# Patient Record
Sex: Female | Born: 1975
Health system: Southern US, Community
[De-identification: ages and names within clinical notes are randomized; demographics above are authoritative.]

## PROBLEM LIST (undated history)

## (undated) ENCOUNTER — Inpatient Hospital Stay (HOSPITAL_COMMUNITY): Payer: Self-pay

## (undated) DIAGNOSIS — F419 Anxiety disorder, unspecified: Secondary | ICD-10-CM

## (undated) DIAGNOSIS — D696 Thrombocytopenia, unspecified: Secondary | ICD-10-CM

## (undated) DIAGNOSIS — F329 Major depressive disorder, single episode, unspecified: Secondary | ICD-10-CM

## (undated) DIAGNOSIS — F32A Depression, unspecified: Secondary | ICD-10-CM

## (undated) DIAGNOSIS — O99113 Other diseases of the blood and blood-forming organs and certain disorders involving the immune mechanism complicating pregnancy, third trimester: Secondary | ICD-10-CM

## (undated) HISTORY — PX: NO PAST SURGERIES: SHX2092

---

## 2014-04-18 ENCOUNTER — Ambulatory Visit: Payer: 59 | Admitting: Podiatrist

## 2014-04-25 ENCOUNTER — Ambulatory Visit: Payer: 59 | Admitting: Podiatrist

## 2014-05-13 ENCOUNTER — Other Ambulatory Visit (HOSPITAL_COMMUNITY): Payer: Self-pay | Admitting: Gynecology

## 2014-05-13 DIAGNOSIS — Z3141 Encounter for fertility testing: Secondary | ICD-10-CM

## 2014-05-14 ENCOUNTER — Encounter: Payer: Self-pay | Admitting: Podiatrist

## 2014-05-14 ENCOUNTER — Ambulatory Visit (INDEPENDENT_AMBULATORY_CARE_PROVIDER_SITE_OTHER): Payer: 59 | Admitting: Podiatrist

## 2014-05-14 ENCOUNTER — Ambulatory Visit (INDEPENDENT_AMBULATORY_CARE_PROVIDER_SITE_OTHER): Payer: 59

## 2014-05-14 ENCOUNTER — Other Ambulatory Visit: Payer: Self-pay | Admitting: Podiatrist

## 2014-05-14 VITALS — BP 91/53 | HR 66 | Resp 16

## 2014-05-14 DIAGNOSIS — M722 Plantar fascial fibromatosis: Secondary | ICD-10-CM

## 2014-05-14 DIAGNOSIS — D3610 Benign neoplasm of peripheral nerves and autonomic nervous system, unspecified: Secondary | ICD-10-CM

## 2014-05-14 DIAGNOSIS — M779 Enthesopathy, unspecified: Secondary | ICD-10-CM

## 2014-05-14 NOTE — Progress Notes (Signed)
   Subjective:    Patient ID: Linda Church, female    DOB: Jun 15, 1975, 39 y.o.   MRN: [id number]  HPI Comments: "I have fasciitis"  Patient c/o aching plantar heel and arch for few months. She does have AM pain. She is an active runner, running about 20 miles per week. She has decreased to 3-5 miles per week. She takes Ibuprofen when really bad.  Also, numbness in 3rd and 4th toes left with some pain in forefoot. She thinks she may have a neuroma.  Foot Pain Associated symptoms include myalgias and numbness.      Review of Systems  Musculoskeletal: Positive for myalgias.  Neurological: Positive for numbness.  All other systems reviewed and are negative.      Objective:   Physical Exam GENERAL APPEARANCE: Alert, conversant. Appropriately groomed. No acute distress.  VASCULAR: Pedal pulses palpable and strong bilateral.  Capillary refill time is immediate to all digits,  Proximal to distal cooling it warm to warm.  Digital hair growth is present bilateral  NEUROLOGIC: sensation is intact epicritically and protectively to 5.07 monofilament at 5/5 sites bilateral.  Light touch is intact bilateral, vibratory sensation intact bilateral, achilles tendon reflex is intact bilateral.  Negative Tinel sign is elicited mild neuroma type symptomatology is noted left forefoot MUSCULOSKELETAL: Pain on palpation plantar medial aspect left foot  at insertion of plantar fascia on the medial calcaneal tubercle. Inflammation at the insertion of the plantar fascia is present. Rectus foot type is seen. DERMATOLOGIC: skin color, texture, and turger are within normal limits.  No preulcerative lesions are seen, no interdigital maceration noted.  No open lesions present.  Digital nails are asymptomatic.      Assessment & Plan:  Plantar fasciitis, neuroma  Plan:  Recommended orthotics for heel pain and neuroma symptomatology- she was scanned at today's visit.  Also discussed injection therapy as well.   Will see her when orthotics are ready for pick up.

## 2014-05-14 NOTE — Patient Instructions (Signed)

## 2014-05-16 ENCOUNTER — Ambulatory Visit (INDEPENDENT_AMBULATORY_CARE_PROVIDER_SITE_OTHER): Payer: 59 | Admitting: Internal Medicine

## 2014-05-16 DIAGNOSIS — Z23 Encounter for immunization: Secondary | ICD-10-CM

## 2014-05-16 NOTE — Progress Notes (Signed)
   Subjective:    Patient ID: Leo Grosser, female    DOB: 31-Jul-1975, 39 y.o.   MRN: [id number]  HPI ledora delker is a 39yo F who is going to [place] from Feb 19 for 10 day trip with her significant [place]. She has not travelled internationally before. Vaccinated to hav, tdap, flu, rabies, since she is a vetinarian  Current Outpatient Prescriptions on File Prior to Visit  Medication Sig Dispense Refill  . buPROPion (WELLBUTRIN XL) 150 MG 24 hr tablet Take 150 mg by mouth daily.    Marland Kitchen escitalopram (LEXAPRO) 5 MG tablet Take 15 mg by mouth daily.    . Prenatal Vit-Fe Fumarate-FA (PRENATAL VITAMIN PO) Take by mouth.     No current facility-administered medications on file prior to visit.   No Known Allergies    Review of Systems     Objective:   Physical Exam        Assessment & Plan:  Pre travel counseling Gave vaccination for typhoid injection Has rx for malaria prophylaxis. Ok to take malarone since she is not pregnant but doing ivf 2 wk after she returns from trip Traveler's diarrhea = she has rx for cipro, and can use azithromycin if it does not work after 3 days. Has hav #1

## 2014-05-20 ENCOUNTER — Ambulatory Visit (HOSPITAL_COMMUNITY)
Admission: RE | Admit: 2014-05-20 | Discharge: 2014-05-20 | Disposition: A | Discharge status: Home / Self Care | Payer: 59 | Source: Ambulatory Visit | Attending: Gynecology | Admitting: Gynecology

## 2014-05-20 DIAGNOSIS — N979 Female infertility, unspecified: Secondary | ICD-10-CM | POA: Diagnosis present

## 2014-05-20 DIAGNOSIS — Z3141 Encounter for fertility testing: Secondary | ICD-10-CM

## 2014-05-20 MED ORDER — IOHEXOL 300 MG/ML  SOLN: 20.0000 mL | Freq: Once | INTRAMUSCULAR | Status: AC | PRN

## 2014-07-02 ENCOUNTER — Ambulatory Visit: Payer: 59 | Admitting: *Deleted

## 2014-07-02 DIAGNOSIS — M722 Plantar fascial fibromatosis: Secondary | ICD-10-CM

## 2014-07-02 NOTE — Patient Instructions (Signed)

## 2014-07-02 NOTE — Progress Notes (Signed)
Patient ID: Linda Church, female   DOB: 17-Nov-1975, 39 y.o.   MRN: [id number] PICKING UP INSERTS

## 2014-10-28 LAB — OB RESULTS CONSOLE GC/CHLAMYDIA
CHLAMYDIA, DNA PROBE: NEGATIVE
Gonorrhea: NEGATIVE

## 2015-03-19 LAB — OB RESULTS CONSOLE RUBELLA ANTIBODY, IGM: Rubella: IMMUNE

## 2015-03-19 LAB — OB RESULTS CONSOLE ABO/RH: RH Type: POSITIVE

## 2015-03-19 LAB — OB RESULTS CONSOLE RPR: RPR: NONREACTIVE

## 2015-03-19 LAB — OB RESULTS CONSOLE HIV ANTIBODY (ROUTINE TESTING): HIV: NONREACTIVE

## 2015-03-19 LAB — OB RESULTS CONSOLE ANTIBODY SCREEN: Antibody Screen: NEGATIVE

## 2015-03-19 LAB — OB RESULTS CONSOLE HEPATITIS B SURFACE ANTIGEN: Hepatitis B Surface Ag: NEGATIVE

## 2015-04-12 NOTE — L&D Delivery Note (Signed)
Operative Delivery Note At 4:12 PM a viable and healthy female was delivered via Vaginal, Vacuum Neurosurgeon).  Presentation: vertex; Position: Occiput,, Anterior; Station: +4.  Verbal consent: obtained from patient.  Risks and benefits discussed in detail.  Risks include, but are not limited to the risks of anesthesia, bleeding, infection, damage to maternal tissues, fetal cephalhematoma.  There is also the risk of inability to effect vaginal delivery of the head, or shoulder dystocia that cannot be resolved by established maneuvers, leading to the need for emergency cesarean section.  Indication for operative delivery: Non reassuring FHR APGAR: pending weight  pending.   Placenta status: Intact, Spontaneous.   Cord:  with the following complications: shoulder cord x one.  Cord pH: 7.14   Anesthesia: Epidural  Instruments: Kiwi x one pull, no pop offs Episiotomy: None Lacerations: 3rd degree Suture Repair: 2.0 vicryl rapide and  0 vicryl Est. Blood Loss (mL):  500  Mom to postpartum.  Baby to NICU.  Tobias Avitabile J 05/31/2015, 6:36 PM

## 2015-05-05 LAB — OB RESULTS CONSOLE GBS: GBS: NEGATIVE

## 2015-05-30 ENCOUNTER — Encounter (HOSPITAL_COMMUNITY): Payer: Self-pay | Admitting: *Deleted

## 2015-05-30 ENCOUNTER — Inpatient Hospital Stay (HOSPITAL_COMMUNITY)
Admission: AD | Admit: 2015-05-30 | Discharge: 2015-06-02 | DRG: 775 | Disposition: A | Discharge status: Home / Self Care | Payer: BLUE CROSS/BLUE SHIELD | Source: Ambulatory Visit | Attending: Obstetrics | Admitting: Obstetrics

## 2015-05-30 DIAGNOSIS — O9912 Other diseases of the blood and blood-forming organs and certain disorders involving the immune mechanism complicating childbirth: Secondary | ICD-10-CM | POA: Diagnosis present

## 2015-05-30 DIAGNOSIS — O99344 Other mental disorders complicating childbirth: Secondary | ICD-10-CM | POA: Diagnosis present

## 2015-05-30 DIAGNOSIS — Z79899 Other long term (current) drug therapy: Secondary | ICD-10-CM

## 2015-05-30 DIAGNOSIS — F411 Generalized anxiety disorder: Secondary | ICD-10-CM | POA: Diagnosis present

## 2015-05-30 DIAGNOSIS — D62 Acute posthemorrhagic anemia: Secondary | ICD-10-CM | POA: Diagnosis not present

## 2015-05-30 DIAGNOSIS — O9081 Anemia of the puerperium: Secondary | ICD-10-CM | POA: Diagnosis not present

## 2015-05-30 DIAGNOSIS — O99113 Other diseases of the blood and blood-forming organs and certain disorders involving the immune mechanism complicating pregnancy, third trimester: Secondary | ICD-10-CM

## 2015-05-30 DIAGNOSIS — Z3A39 39 weeks gestation of pregnancy: Secondary | ICD-10-CM | POA: Diagnosis not present

## 2015-05-30 DIAGNOSIS — O411230 Chorioamnionitis, third trimester, not applicable or unspecified: Secondary | ICD-10-CM | POA: Diagnosis present

## 2015-05-30 DIAGNOSIS — D6959 Other secondary thrombocytopenia: Secondary | ICD-10-CM | POA: Diagnosis present

## 2015-05-30 DIAGNOSIS — F329 Major depressive disorder, single episode, unspecified: Secondary | ICD-10-CM | POA: Diagnosis present

## 2015-05-30 DIAGNOSIS — O4292 Full-term premature rupture of membranes, unspecified as to length of time between rupture and onset of labor: Secondary | ICD-10-CM | POA: Diagnosis present

## 2015-05-30 DIAGNOSIS — D696 Thrombocytopenia, unspecified: Secondary | ICD-10-CM

## 2015-05-30 DIAGNOSIS — O4290 Premature rupture of membranes, unspecified as to length of time between rupture and onset of labor, unspecified weeks of gestation: Secondary | ICD-10-CM | POA: Diagnosis present

## 2015-05-30 DIAGNOSIS — O42919 Preterm premature rupture of membranes, unspecified as to length of time between rupture and onset of labor, unspecified trimester: Secondary | ICD-10-CM | POA: Diagnosis present

## 2015-05-30 HISTORY — DX: Thrombocytopenia, unspecified: D69.6

## 2015-05-30 HISTORY — DX: Anxiety disorder, unspecified: F41.9

## 2015-05-30 HISTORY — DX: Major depressive disorder, single episode, unspecified: F32.9

## 2015-05-30 HISTORY — DX: Depression, unspecified: F32.A

## 2015-05-30 HISTORY — DX: Other diseases of the blood and blood-forming organs and certain disorders involving the immune mechanism complicating pregnancy, third trimester: O99.113

## 2015-05-30 LAB — CBC
HEMATOCRIT: 36.2 % (ref 36.0–46.0)
Hemoglobin: 12.9 g/dL (ref 12.0–15.0)
MCH: 30.6 pg (ref 26.0–34.0)
MCHC: 35.6 g/dL (ref 30.0–36.0)
MCV: 85.8 fL (ref 78.0–100.0)
Platelets: 122 10*3/uL — ABNORMAL LOW (ref 150–400)
RBC: 4.22 MIL/uL (ref 3.87–5.11)
RDW: 14.1 % (ref 11.5–15.5)
WBC: 9.3 10*3/uL (ref 4.0–10.5)

## 2015-05-30 LAB — TYPE AND SCREEN
ABO/RH(D): A POS
ANTIBODY SCREEN: NEGATIVE

## 2015-05-30 LAB — POCT FERN TEST: POCT Fern Test: POSITIVE

## 2015-05-30 MED ORDER — LIDOCAINE HCL (PF) 1 % IJ SOLN: 30.0000 mL | INTRAMUSCULAR | Status: DC | PRN

## 2015-05-30 MED ORDER — OXYCODONE-ACETAMINOPHEN 5-325 MG PO TABS: 2.0000 | ORAL_TABLET | ORAL | Status: DC | PRN

## 2015-05-30 MED ORDER — LACTATED RINGERS IV SOLN: 500.0000 mL | INTRAVENOUS | Status: DC | PRN

## 2015-05-30 MED ORDER — OXYCODONE-ACETAMINOPHEN 5-325 MG PO TABS: 1.0000 | ORAL_TABLET | ORAL | Status: DC | PRN

## 2015-05-30 MED ORDER — OXYTOCIN BOLUS FROM INFUSION: 500.0000 mL | INTRAVENOUS | Status: DC

## 2015-05-30 MED ORDER — ONDANSETRON HCL 4 MG/2ML IJ SOLN: 4.0000 mg | Freq: Four times a day (QID) | INTRAMUSCULAR | Status: DC | PRN

## 2015-05-30 MED ORDER — LACTATED RINGERS IV SOLN
2.5000 [IU]/h | INTRAVENOUS | Status: DC
  Filled 2015-05-30: qty 4

## 2015-05-30 MED ORDER — OXYTOCIN 10 UNIT/ML IJ SOLN: 10.0000 [IU] | Freq: Once | INTRAMUSCULAR | Status: DC | PRN

## 2015-05-30 MED ORDER — CITRIC ACID-SODIUM CITRATE 334-500 MG/5ML PO SOLN: 30.0000 mL | ORAL | Status: DC | PRN

## 2015-05-30 MED ORDER — ACETAMINOPHEN 325 MG PO TABS: 650.0000 mg | ORAL_TABLET | ORAL | Status: DC | PRN

## 2015-05-30 NOTE — H&P (Signed)
OB ADMISSION/ HISTORY & PHYSICAL:  Admission Date: 05/30/2015  7:18 PM  Admit Diagnosis: 39.6 wks / PROM   Linda Church is a 40 y.o. female presenting for PROM @ 1315.  Prenatal History: G1P0   EDC : 05/31/2015, by LMP Prenatal care at [facility] since 14.[redacted] weeks gestation / transferred care from [facility] Primary Care Provider at [facility]: Laury Deep, CNM  Prenatal course complicated by IUI pregnancy, gestational thrombocytopenia, low maternal weight gain, generalized anxiety disorder well-managed on Wellbutrin and Lexapro  Prenatal Labs: ABO, Rh: A (12/08 0000)  Antibody: PENDING (02/18 2104) Rubella: Immune (12/08 0000)  RPR: Nonreactive (12/08 0000)  HBsAg: Negative (12/08 0000)  HIV: Non-reactive (12/08 0000)  GBS: Negative (01/24 0000)  GTT : Abnormal - 168 / 3 hr GTT Normal    Medical / Surgical History :  Past medical history:  Past Medical History  Diagnosis Date  . Anxiety   . Depression   . Indication for care in labor or delivery 05/30/2015  . Gestational thrombocytopenia without hemorrhage in third trimester ([place]) 05/30/2015     Past surgical history:  Past Surgical History  Procedure Laterality Date  . No past surgeries       Family History: History reviewed. No pertinent family history.   Social History:  reports that she has never smoked. She does not have any smokeless tobacco history on file. She reports that she drinks alcohol. She reports that she does not use illicit drugs.   Allergies: Review of patient's allergies indicates no known allergies.    Current Medications at time of admission:  Prescriptions prior to admission  Medication Sig Dispense Refill Last Dose  . buPROPion (WELLBUTRIN XL) 300 MG 24 hr tablet Take 300 mg by mouth daily.   05/30/2015 at Unknown time  . Cholecalciferol (VITAMIN D3) 1000 units CAPS Take 1 capsule by mouth daily.   05/30/2015 at Unknown time  . escitalopram (LEXAPRO) 20 MG tablet Take  20 mg by mouth daily.   05/30/2015 at Unknown time  . ferrous sulfate 325 (65 FE) MG tablet Take 325 mg by mouth daily with breakfast.   05/30/2015 at Unknown time  . Prenatal Vit-Fe Fumarate-FA (PRENATAL VITAMIN PO) Take 1 tablet by mouth daily.    05/30/2015 at Unknown time    Results for orders placed or performed during the hospital encounter of 05/30/15 (from the past 24 hour(s))  Fern Test     Status: Abnormal   Collection Time: 05/30/15  7:59 PM  Result Value Ref Range   POCT Fern Test Positive = ruptured amniotic membanes   CBC     Status: Abnormal   Collection Time: 05/30/15  9:04 PM  Result Value Ref Range   WBC 9.3 4.0 - 10.5 K/uL   RBC 4.22 3.87 - 5.11 MIL/uL   Hemoglobin 12.9 12.0 - 15.0 g/dL   HCT 36.2 36.0 - 46.0 %   MCV 85.8 78.0 - 100.0 fL   MCH 30.6 26.0 - 34.0 pg   MCHC 35.6 30.0 - 36.0 g/dL   RDW 14.1 11.5 - 15.5 %   Platelets 122 (L) 150 - 400 K/uL  Type and screen [place]     Status: None (Preliminary result)   Collection Time: 05/30/15  9:04 PM  Result Value Ref Range   ABO/RH(D) A POS    Antibody Screen PENDING    Sample Expiration 06/02/2015      Review of Systems: Active FM onset of ctx @  1315 currently every 3-4 minutes LOF / SROM @ 1315   Physical Exam:  Today's Vitals   05/30/15 1937 05/30/15 1938 05/30/15 2058  BP:  121/88 109/74  Pulse:  70 73  Temp:  98.3 F (36.8 C) 97.8 F (36.6 C)  TempSrc:   Oral  Resp:  18 18  Height:  5\' 3"  (1.6 m)   Weight:  63.504 kg (140 lb)   PainSc: 7      General: alert and oriented x 3, NAD Heart: RRR, no murmurs Lungs: CTAB Abdomen: Soft and non-tender, non-distended / Uterus: Gravid, non-tender Extremities: No edema Genitalia / VE: Dilation: 1.5 Effacement (%): 90 Station: -1 Exam by:: K.Wilson,RN FHR: 130 bpm / moderate variability / accels present / no decels TOCO: regular every 3-4 mins  Labs:     Recent Labs  05/30/15 2104  WBC 9.3  HGB 12.9  HCT 36.2  PLT  122*     Assessment:  40 y.o. G1P0 at [redacted]w[redacted]d  1. Labor: early 2. Fetal Wellbeing: Category 1  3. Pain Control: doula and spousal support 4. GBS: negative 5. EFW ~ 7lbs by Leopold's  Plan:  1. Admit to BS 2. Routine L&D orders 3. Expectant management  *Patient informed that she is early labor with little progress since PROM despite more frequent and more intense contractions / options discussed were: 1) do nothing and wait, 2) aggressive cervical exam to help facilitate more intense contractions, 3) insert cervical balloon - informed that cervical balloon may not be viable options d/t effacement being 90% I do not expect it would be very beneficial, and 4) starting pitocin to augment labor.  Discussed with patient and spouse the recent changes in the call schedules for the practice.  She was informed that with the new changes there will be NO CNM on-call after 0800 on Sundays and her care will be transferred to Dr. Pamala Hurry who is on-call MD from 0800 until 1700 tomorrow / they both verbalized an understanding.  Patient and spouse requested to discuss with doula and will inform CNM of decision later.   **Dr. Pamala Hurry notified of admission / plan of care    Graceann Congress, MSN, CNM 05/30/2015, 9:47 PM

## 2015-05-30 NOTE — Progress Notes (Signed)
Patient ID: Linda Church, female   DOB: 09-12-1975, 40 y.o.   MRN: [id number] Subjective: Linda Church is a 40 y.o. G1P0 at [redacted]w[redacted]d by LMP admitted for rupture of membranes.  Objective: Filed Vitals:   05/30/15 1938 05/30/15 2058 05/30/15 2252  BP: 121/88 109/74   Pulse: 70 73   Temp: 98.3 F (36.8 C) 98 F (36.7 C) 97.6 F (36.4 C)  TempSrc:  Oral Oral  Resp: 18 18   Height: 5\' 3"  (1.6 m)    Weight: 63.504 kg (140 lb)     FHT:  FHR: 130 bpm, variability: moderate,  accelerations:  Present,  decelerations:  Absent UC:   regular, every 3-4 minutes SVE:   Dilation: 3 Effacement (%): 90 Station: -1 Exam by: Renato Battles, CNM Stimulation applied to inside of cervical os during SVE / patient tolerated well / some bloody show.  Labs:   Recent Labs  05/30/15 2104  WBC 9.3  HGB 12.9  HCT 36.2  PLT 122*    Assessment / Plan: Spontaneous labor, protracted progress  Labor: protracted early labor Preeclampsia:  N/A Fetal Wellbeing:  Category I Pain Control:  Labor support without medications  Ambulate in hallway x 1-2 hours with intermittent FHR tracing upon return Anticipated MOD:  NSVD  Graceann Congress, MSN, CNM 05/30/2015, 10:50 PM

## 2015-05-30 NOTE — MAU Note (Signed)
Pt has had ctx on and off all day. Thinks water broke around 1pm

## 2015-05-31 ENCOUNTER — Encounter (HOSPITAL_COMMUNITY): Payer: Self-pay | Admitting: *Deleted

## 2015-05-31 ENCOUNTER — Inpatient Hospital Stay (HOSPITAL_COMMUNITY): Payer: BLUE CROSS/BLUE SHIELD | Admitting: Anesthesiology

## 2015-05-31 LAB — CBC WITH DIFFERENTIAL/PLATELET
BASOS ABS: 0 10*3/uL (ref 0.0–0.1)
Basophils Relative: 0 %
Eosinophils Absolute: 0 10*3/uL (ref 0.0–0.7)
Eosinophils Relative: 0 %
HEMATOCRIT: 30.5 % — AB (ref 36.0–46.0)
HEMOGLOBIN: 10.8 g/dL — AB (ref 12.0–15.0)
LYMPHS PCT: 2 %
Lymphs Abs: 0.5 10*3/uL — ABNORMAL LOW (ref 0.7–4.0)
MCH: 30.1 pg (ref 26.0–34.0)
MCHC: 35.4 g/dL (ref 30.0–36.0)
MCV: 85 fL (ref 78.0–100.0)
MONO ABS: 0.7 10*3/uL (ref 0.1–1.0)
MONOS PCT: 4 %
NEUTROS ABS: 19.6 10*3/uL — AB (ref 1.7–7.7)
Neutrophils Relative %: 94 %
Platelets: 108 10*3/uL — ABNORMAL LOW (ref 150–400)
RBC: 3.59 MIL/uL — ABNORMAL LOW (ref 3.87–5.11)
RDW: 14.1 % (ref 11.5–15.5)
WBC: 20.8 10*3/uL — ABNORMAL HIGH (ref 4.0–10.5)

## 2015-05-31 LAB — RPR: RPR Ser Ql: NONREACTIVE

## 2015-05-31 LAB — ABO/RH: ABO/RH(D): A POS

## 2015-05-31 MED ORDER — ONDANSETRON HCL 4 MG/2ML IJ SOLN: 4.0000 mg | INTRAMUSCULAR | Status: DC | PRN

## 2015-05-31 MED ORDER — PRENATAL MULTIVITAMIN CH
1.0000 | ORAL_TABLET | Freq: Every day | ORAL | Status: DC
  Administered 2015-06-01 – 2015-06-02 (×2): 1 via ORAL
  Filled 2015-05-31 (×2): qty 1

## 2015-05-31 MED ORDER — FENTANYL 2.5 MCG/ML BUPIVACAINE 1/10 % EPIDURAL INFUSION (WH - ANES)
14.0000 mL/h | INTRAMUSCULAR | Status: DC | PRN
  Administered 2015-05-31 (×4): 14 mL/h via EPIDURAL
  Filled 2015-05-31 (×3): qty 125

## 2015-05-31 MED ORDER — BUPROPION HCL ER (XL) 300 MG PO TB24
300.0000 mg | ORAL_TABLET | Freq: Every day | ORAL | Status: DC
  Administered 2015-06-01 – 2015-06-02 (×2): 300 mg via ORAL
  Filled 2015-05-31 (×3): qty 1

## 2015-05-31 MED ORDER — ACETAMINOPHEN 325 MG PO TABS: 650.0000 mg | ORAL_TABLET | ORAL | Status: DC | PRN

## 2015-05-31 MED ORDER — LACTATED RINGERS IV SOLN
INTRAVENOUS | Status: DC
  Administered 2015-05-31 (×2): via INTRAVENOUS

## 2015-05-31 MED ORDER — LACTATED RINGERS IV BOLUS (SEPSIS): 500.0000 mL | Freq: Once | INTRAVENOUS | Status: DC

## 2015-05-31 MED ORDER — METHYLERGONOVINE MALEATE 0.2 MG PO TABS: 0.2000 mg | ORAL_TABLET | ORAL | Status: DC | PRN

## 2015-05-31 MED ORDER — DIPHENHYDRAMINE HCL 50 MG/ML IJ SOLN
12.5000 mg | INTRAMUSCULAR | Status: DC | PRN
Start: 2015-05-31 — End: 2015-05-31

## 2015-05-31 MED ORDER — ESCITALOPRAM OXALATE 20 MG PO TABS
20.0000 mg | ORAL_TABLET | Freq: Every day | ORAL | Status: DC
  Administered 2015-06-01 – 2015-06-02 (×2): 20 mg via ORAL
  Filled 2015-05-31 (×3): qty 1

## 2015-05-31 MED ORDER — SENNOSIDES-DOCUSATE SODIUM 8.6-50 MG PO TABS
2.0000 | ORAL_TABLET | ORAL | Status: DC
Start: 2015-06-01 — End: 2015-06-02
  Administered 2015-05-31 – 2015-06-01 (×2): 2 via ORAL
  Filled 2015-05-31 (×2): qty 2

## 2015-05-31 MED ORDER — ZOLPIDEM TARTRATE 5 MG PO TABS: 5.0000 mg | ORAL_TABLET | Freq: Every evening | ORAL | Status: DC | PRN

## 2015-05-31 MED ORDER — IBUPROFEN 600 MG PO TABS
600.0000 mg | ORAL_TABLET | Freq: Four times a day (QID) | ORAL | Status: DC
  Administered 2015-05-31 – 2015-06-01 (×2): 600 mg via ORAL
  Filled 2015-05-31 (×2): qty 1

## 2015-05-31 MED ORDER — EPHEDRINE 5 MG/ML INJ: 10.0000 mg | INTRAVENOUS | Status: DC | PRN

## 2015-05-31 MED ORDER — GENTAMICIN SULFATE 40 MG/ML IJ SOLN
140.0000 mg | Freq: Three times a day (TID) | INTRAVENOUS | Status: DC
  Administered 2015-05-31: 140 mg via INTRAVENOUS
  Filled 2015-05-31 (×3): qty 3.5

## 2015-05-31 MED ORDER — OXYTOCIN 10 UNIT/ML IJ SOLN
1.0000 m[IU]/min | INTRAVENOUS | Status: DC
  Administered 2015-05-31: 2 m[IU]/min via INTRAVENOUS

## 2015-05-31 MED ORDER — SODIUM CHLORIDE 0.9 % IV SOLN
1.0000 g | INTRAVENOUS | Status: DC
  Administered 2015-05-31: 1 g via INTRAVENOUS
  Filled 2015-05-31 (×4): qty 1000

## 2015-05-31 MED ORDER — DIPHENHYDRAMINE HCL 25 MG PO CAPS: 25.0000 mg | ORAL_CAPSULE | Freq: Four times a day (QID) | ORAL | Status: DC | PRN

## 2015-05-31 MED ORDER — BENZOCAINE-MENTHOL 20-0.5 % EX AERO
1.0000 "application " | INHALATION_SPRAY | CUTANEOUS | Status: DC | PRN
  Administered 2015-05-31: 1 via TOPICAL
  Filled 2015-05-31: qty 56

## 2015-05-31 MED ORDER — ESCITALOPRAM OXALATE 20 MG PO TABS: 20.0000 mg | ORAL_TABLET | Freq: Every day | ORAL | Status: DC

## 2015-05-31 MED ORDER — MISOPROSTOL 200 MCG PO TABS
ORAL_TABLET | ORAL | Status: AC
  Filled 2015-05-31: qty 5

## 2015-05-31 MED ORDER — PHENYLEPHRINE 40 MCG/ML (10ML) SYRINGE FOR IV PUSH (FOR BLOOD PRESSURE SUPPORT)
80.0000 ug | PREFILLED_SYRINGE | INTRAVENOUS | Status: DC | PRN
  Filled 2015-05-31: qty 20

## 2015-05-31 MED ORDER — METHYLERGONOVINE MALEATE 0.2 MG/ML IJ SOLN: 0.2000 mg | INTRAMUSCULAR | Status: DC | PRN

## 2015-05-31 MED ORDER — LIDOCAINE HCL (PF) 1 % IJ SOLN
INTRAMUSCULAR | Status: DC | PRN
  Administered 2015-05-31 (×2): 4 mL

## 2015-05-31 MED ORDER — ESCITALOPRAM OXALATE 20 MG PO TABS
20.0000 mg | ORAL_TABLET | Freq: Every day | ORAL | Status: DC
  Administered 2015-05-31: 20 mg via ORAL
  Filled 2015-05-31 (×2): qty 1

## 2015-05-31 MED ORDER — ONDANSETRON HCL 4 MG PO TABS: 4.0000 mg | ORAL_TABLET | ORAL | Status: DC | PRN

## 2015-05-31 MED ORDER — SIMETHICONE 80 MG PO CHEW: 80.0000 mg | CHEWABLE_TABLET | ORAL | Status: DC | PRN

## 2015-05-31 MED ORDER — PHENYLEPHRINE 40 MCG/ML (10ML) SYRINGE FOR IV PUSH (FOR BLOOD PRESSURE SUPPORT): 80.0000 ug | PREFILLED_SYRINGE | INTRAVENOUS | Status: DC | PRN

## 2015-05-31 MED ORDER — DIBUCAINE 1 % RE OINT: 1.0000 "application " | TOPICAL_OINTMENT | RECTAL | Status: DC | PRN

## 2015-05-31 MED ORDER — SODIUM BICARBONATE 8.4 % IV SOLN
INTRAVENOUS | Status: DC | PRN
  Administered 2015-05-31: 8 mL via EPIDURAL

## 2015-05-31 MED ORDER — LANOLIN HYDROUS EX OINT: TOPICAL_OINTMENT | CUTANEOUS | Status: DC | PRN

## 2015-05-31 MED ORDER — SODIUM CHLORIDE 0.9 % IV SOLN
2.0000 g | Freq: Once | INTRAVENOUS | Status: AC
  Administered 2015-05-31: 2 g via INTRAVENOUS
  Filled 2015-05-31: qty 2000

## 2015-05-31 MED ORDER — LACTATED RINGERS IV SOLN
500.0000 mL | Freq: Once | INTRAVENOUS | Status: AC
  Administered 2015-05-31: 500 mL via INTRAVENOUS

## 2015-05-31 MED ORDER — TERBUTALINE SULFATE 1 MG/ML IJ SOLN: 0.2500 mg | Freq: Once | INTRAMUSCULAR | Status: DC | PRN

## 2015-05-31 MED ORDER — BUPROPION HCL ER (XL) 300 MG PO TB24: 300.0000 mg | ORAL_TABLET | Freq: Every day | ORAL | Status: DC

## 2015-05-31 MED ORDER — TETANUS-DIPHTH-ACELL PERTUSSIS 5-2.5-18.5 LF-MCG/0.5 IM SUSP: 0.5000 mL | Freq: Once | INTRAMUSCULAR | Status: DC

## 2015-05-31 MED ORDER — BUPROPION HCL ER (XL) 300 MG PO TB24
300.0000 mg | ORAL_TABLET | Freq: Every day | ORAL | Status: DC
  Administered 2015-05-31: 300 mg via ORAL
  Filled 2015-05-31 (×2): qty 1

## 2015-05-31 MED ORDER — WITCH HAZEL-GLYCERIN EX PADS: 1.0000 "application " | MEDICATED_PAD | CUTANEOUS | Status: DC | PRN

## 2015-05-31 NOTE — Progress Notes (Signed)
ANTIBIOTIC CONSULT NOTE - INITIAL  Pharmacy Consult for Gentamicin Indication: Chorioamnionitis  No Known Allergies  Patient Measurements: Height: 5\' 3"  (160 cm) Weight: 140 lb (63.504 kg) IBW/kg (Calculated) : 52.4 Adjusted Body Weight: 55.8kg  Vital Signs: Temp: 100.4 F (38 C) (02/19 1000) Temp Source: Axillary (02/19 1000) BP: 110/74 mmHg (02/19 1000) Pulse Rate: 83 (02/19 1000)  Labs:  Recent Labs  05/30/15 2104  WBC 9.3  HGB 12.9  PLT 122*   No results for input(s): GENTTROUGH, GENTPEAK, GENTRANDOM in the last 72 hours.   Microbiology: Recent Results (from the past 720 hour(s))  OB RESULT CONSOLE Group B Strep     Status: None   Collection Time: 05/05/15 12:00 AM  Result Value Ref Range Status   GBS Negative  Final    Medications:  Ampicillin 2 grams IV x 1 then 1 grams Q4 hours  Assessment: 40 y.o. female G1P0 at [redacted]w[redacted]d with maternal temp. Estimated Ke = 0.274, Est. Vd = 0.4L/kg  Goal of Therapy:  Gentamicin peak 6-8 mg/L and Trough < 1 mg/L  Plan:  Gentamicin 140 mg IV every 8 hrs  Check Scr with next labs if gentamicin continued. Will check gentamicin levels if continued > 72hr or clinically indicated.  Joetta Manners D 05/31/2015,10:26 AM

## 2015-05-31 NOTE — Anesthesia Preprocedure Evaluation (Addendum)
Anesthesia Evaluation  Patient identified by MRN, date of birth, ID band Patient awake    Reviewed: Allergy & Precautions, NPO status , Patient's Chart, lab work & pertinent test results  Airway Mallampati: II  TM Distance: >3 FB Neck ROM: Full    Dental no notable dental hx.    Pulmonary neg pulmonary ROS,    Pulmonary exam normal breath sounds clear to auscultation       Cardiovascular negative cardio ROS Normal cardiovascular exam Rhythm:Regular Rate:Normal     Neuro/Psych PSYCHIATRIC DISORDERS Anxiety Depression negative neurological ROS     GI/Hepatic negative GI ROS, Neg liver ROS,   Endo/Other  negative endocrine ROS  Renal/GU negative Renal ROS     Musculoskeletal negative musculoskeletal ROS (+)   Abdominal   Peds  Hematology negative hematology ROS (+)   Anesthesia Other Findings   Reproductive/Obstetrics (+) Pregnancy                            Anesthesia Physical Anesthesia Plan  ASA: II  Anesthesia Plan: Epidural   Post-op Pain Management:    Induction:   Airway Management Planned:   Additional Equipment:   Intra-op Plan:   Post-operative Plan:   Informed Consent: I have reviewed the patients History and Physical, chart, labs and discussed the procedure including the risks, benefits and alternatives for the proposed anesthesia with the patient or authorized representative who has indicated his/her understanding and acceptance.     Plan Discussed with:   Anesthesia Plan Comments:         Anesthesia Quick Evaluation

## 2015-05-31 NOTE — Anesthesia Procedure Notes (Signed)
Epidural Patient location during procedure: OB  Staffing Anesthesiologist: Nolon Nations Performed by: anesthesiologist   Preanesthetic Checklist Completed: patient identified, pre-op evaluation, timeout performed, IV checked, risks and benefits discussed and monitors and equipment checked  Epidural Patient position: sitting Prep: site prepped and draped and DuraPrep Patient monitoring: heart rate Approach: midline Location: L2-L3 Injection technique: LOR air and LOR saline  Needle:  Needle type: Tuohy  Needle gauge: 17 G Needle length: 9 cm Needle insertion depth: 5 cm Catheter type: closed end flexible Catheter size: 19 Gauge Catheter at skin depth: 11 cm Test dose: negative  Assessment Sensory level: T8 Events: blood not aspirated, injection not painful, no injection resistance, negative IV test and no paresthesia  Additional Notes Reason for block:procedure for pain

## 2015-05-31 NOTE — Progress Notes (Signed)
Patient ID: Natascha Kielty, female   DOB: 1975-12-25, 40 y.o.   MRN: [id number] S: Exhausted, restless, increasing pain. Requesting cervical exam.  If no cervical change, epidural requested.   ODanley Danker Vitals:   05/30/15 1938 05/30/15 2058 05/30/15 2252  BP: 121/88 109/74   Pulse: 70 73   Temp: 98.3 F (36.8 C) 98 F (36.7 C) 97.6 F (36.4 C)  TempSrc:  Oral Oral  Resp: 18 18   Height: 5\' 3"  (1.6 m)    Weight: 63.504 kg (140 lb)       FHT:  FHR: 150 bpm (intermittent) UC:   regular, every 3-4 minutes SVE:   Dilation: 3 Effacement (%): 90 Station: -1 Exam by: Renato Battles, CNM Bulging forebag   A / P: Protracted early phase of labor  Fetal Wellbeing:  Category I Pain Control:  Epidural - planning Plan AROM after epidural  Anticipated MOD:  NSVD  Laury Deep, Jerilynn Mages MSN, CNM 05/31/2015, 12:53 AM

## 2015-05-31 NOTE — Progress Notes (Signed)
Patient ID: Linda Church, female   DOB: 17-Apr-1975, 40 y.o.   MRN: [id number] S: Doing well, pain well-controlled with epidural. At peace with decision to get epidural.   O: Filed Vitals:   05/31/15 0140 05/31/15 0141 05/31/15 0145 05/31/15 0150  BP:  130/72 118/76 112/77  Pulse:  70 75 76  Temp:      TempSrc:      Resp:      Height:      Weight:      SpO2: 100%  100% 100%     FHT:  FHR: 130 bpm, variability: moderate,  accelerations:  Present,  decelerations:  Present variables UC:   regular, every 2-4 minutes SVE:   Dilation: 3 Effacement (%): 90 Station: -1 Exam by:: Linda Church, cnm AROM with moderate amount thin meconium stained fluid Small amount of bloody show with clots  A / P: Protracted early phase of labor  Fetal Wellbeing:  Category I Pain Control:  Epidural  Anticipated MOD:  NSVD  Laury Deep, M MSN, CNM 05/31/2015, 2:04 AM

## 2015-05-31 NOTE — Progress Notes (Addendum)
Patient ID: Linda Church, female   DOB: 12/03/1975, 40 y.o.   MRN: [id number] S: Doing well, pain well-managed with epidural . Informed RN of pressure , but upon further assessment there was abdominal pressure.  Her cervix is unchanged in 4 hours.   O: Filed Vitals:   05/31/15 0400 05/31/15 0430 05/31/15 0500 05/31/15 0530  BP: 118/78 125/83 106/64 111/58  Pulse: 94 78 77 80  Temp: 98.8 F (37.1 C)     TempSrc: Oral     Resp:      Height:      Weight:      SpO2:         FHT:  FHR: 140 bpm, variability: moderate,  accelerations:  Present,  decelerations:  Absent UC:   irregular, every 1-5.5 minutes SVE:   Dilation: 4 Effacement (%): 90 Station: -1 Exam by:: ansah-mensah, rnc    A / P: Protracted latent phase  Fetal Wellbeing:  Category I Pain Control:  Epidural Start Pitocin 2x2 augmentation Anticipated MOD:  NSVD   *Discussed PROM x 17 hrs without active labor progression - anticipate long labor with first labor.  Eventually will develop chorioamnionitis; which is an infection of membranes with prolonged ROM - once febrile with infection, little to no options at that time as it will be crisis management with the high likelihood of cesarean delivery. Recommend augmenting labor process to reduce the risk of infection with prolonged ROM to maintain labor options and labor plan preferences. Discussed that no longer a candidate for expectant management with no cervical change in 4 hours. Advised must actively manage a now abnormal labor process. If remote from delivery and develop chorioamnionitis - concern for infant infection - need for cesarean delivery and likely NICU admission. Maternal fatigue and uterine fatigue are also additional concerns for prolonged labor. Recommend to anticipate long day after an already long night. Patient and spouse agree to augmentation. Verbalizes understanding. ** Patient, spouse and doula reminded of new call schedule and that care will be  transferred to Dr. Pamala Hurry at 0800. Understanding verbalized.  >Dr. Pamala Hurry notified of patient status and transfer of care at 0800. Notified that a copy of patient's birth plan is available for her review in call room.  Linda Church, M MSN, CNM 05/31/2015, 6:13 AM

## 2015-05-31 NOTE — Progress Notes (Signed)
S: Doing well, no complaints, pain well controlled with epidural, fever resolved with abx   O: BP 111/64 mmHg  Pulse 82  Temp(Src) 99.5 F (37.5 C) (Axillary)  Resp 18  Ht 5\' 3"  (1.6 m)  Wt 63.504 kg (140 lb)  BMI 24.81 kg/m2  SpO2 100%   FHT:  FHR: 135s bpm, variability: moderate,  accelerations:  Present,  decelerations:  Absent UC:   irregular, every 3-6 minutes, MVU low SVE:   Dilation: 6 Effacement (%): 80 Station: 0 Exam by:: Dr. Pamala Hurry   A / P:  40 y.o.  Obstetric History   G1   P0   T0   P0   A0   TAB0   SAB0   E0   M0   L0    at [redacted]w[redacted]d IOL after SROM, slow progress, contractions inadequate, continue to increase pitocin. Abx for chorio  Fetal Wellbeing:  Category I Pain Control:  Epidural  Anticipated MOD:  NSVD  Esvin Hnat A. 05/31/2015, 12:38 PM

## 2015-05-31 NOTE — Progress Notes (Signed)
Called for increased pain.  Epidural has been working well. Pitocin increasing. Currently more numb on left. Some breakthrough pain at top and bottom of uterus.  A/P: working epidural. Test dose negative. Dosed with Lidocaine 2% 8 cc.  After 10 minutes, Linda Church reports improved analgesia.

## 2015-05-31 NOTE — Progress Notes (Signed)
S: Doing well, no complaints, pain well controlled with epidural  O: BP 106/69 mmHg  Pulse 94  Temp(Src) 99.1 F (37.3 C) (Axillary)  Resp 20  Ht 5\' 3"  (1.6 m)  Wt 63.504 kg (140 lb)  BMI 24.81 kg/m2  SpO2 100%   FHT:  FHR: 145s bpm, variability: moderate,  accelerations:  Present,  decelerations:  Absent UC:   regular, every 2 minutes SVE:   Dilation: Lip/rim Effacement (%): 100 Station: +1 Exam by:: Dr. Pamala Hurry   A / P:  40 y.o.  Obstetric History   G1   P0   T0   P0   A0   TAB0   SAB0   E0   M0   L0    at [redacted]w[redacted]d Induction of labor due to PROM,  progressing well on pitocin. Will allow 45 min to 1 hr to reduce rim and passive descent then begin pushing  Fetal Wellbeing:  Category I Pain Control:  Epidural  Anticipated MOD:  NSVD  Linda Church A. 05/31/2015, 3:55 PM

## 2015-05-31 NOTE — Progress Notes (Signed)
S: Doing well, no complaints, pain well controlled with epidural  O: BP 114/77 mmHg  Pulse 84  Temp(Src) 99.3 F (37.4 C) (Axillary)  Resp 18  Ht 5\' 3"  (1.6 m)  Wt 63.504 kg (140 lb)  BMI 24.81 kg/m2  SpO2 100%   FHT:  FHR: 140s bpm, variability: moderate,  accelerations:  Present,  decelerations:  Absent UC:   regular, every 3-5 minutes SVE:   Dilation: 5 Effacement (%): 90 Station: 0 Exam by:: Dr. Memory Dance but thin meconium, IUPC placed  A / P:  40 y.o.  Obstetric History   G1   P0   T0   P0   A0   TAB0   SAB0   E0   M0   L0    at [redacted]w[redacted]d Protracted active phase, SROM 1p, expectant management until 1am, forebag ROM, no change until 6am when pitocin started, no change since.  Fetal Wellbeing:  Category I Pain Control:  Epidural  Anticipated MOD:  NSVD  Jennifer Payes A. 05/31/2015, 8:52 AM

## 2015-05-31 NOTE — Consult Note (Signed)
Neonatology Note:  Attendance at Code Apgar:   Our team responded to a Code Apgar call to room # 26 following NSVD, due to infant with apnea. The requesting physician was Dr. Ronita Hipps. The mother is a G1P0 A pos, GBS neg with prolonged ROM and suspected chorioamnionitis. ROM occurred 27 hours PTD and the fluid was clear. The highest maternal temperature during labor was 100.4 degrees and she was treated with Ampicillin and Gentamicin > 4 hours before delivery. She is on Wellbutrin and Lexapro for anxiety. Pregnancy by IUI complicated by gestational thrombocytopenia and low maternal weight gain. Vacuum-assisted delivery due to [facility].  At delivery, the baby was apneic and blue. The OB nursing staff in attendance gave vigorous stimulation and a Code Apgar was called. Our team arrived at 4 minutes of life, at which time the baby was getting PPV, but remained apneic; the HR was > 100 and color remained blue and pale. I bulb suctioned for a small amount of clear mucous, then resumed PPV. We noted the supplemental O2 was not attached, and we hooked this up and gave 40% FIO2, with prompt improvement in the baby's color. We stopped PPV at about 6 minutes, but she continued to be apneic. PPV was continued. I intubated her atraumatically on the first attempt at 8 minutes of life with a 3.5 mm ETT to a depth of 9.5 cm at the lips. The CO2 detector turned yellow right away and breath sounds could be heard bilaterally. By about 11-12 minutes, the baby was breathing some in addition to the mechanical ventilations. Ap 1/3/5. The baby continued to be very floppy, but was opening her eyes and taking a few spontaneous breaths.  I spoke with the parents in the DR and they were able to see the baby briefly before we transported her to the NICU for further care. Her mother accompanied her. Cord pH 7.14  Real Cons, MD

## 2015-06-01 LAB — CBC
HEMATOCRIT: 26 % — AB (ref 36.0–46.0)
HEMOGLOBIN: 9.1 g/dL — AB (ref 12.0–15.0)
MCH: 29.8 pg (ref 26.0–34.0)
MCHC: 35 g/dL (ref 30.0–36.0)
MCV: 85.2 fL (ref 78.0–100.0)
Platelets: 96 10*3/uL — ABNORMAL LOW (ref 150–400)
RBC: 3.05 MIL/uL — AB (ref 3.87–5.11)
RDW: 14.3 % (ref 11.5–15.5)
WBC: 16.5 10*3/uL — ABNORMAL HIGH (ref 4.0–10.5)

## 2015-06-01 MED ORDER — DOCUSATE SODIUM 100 MG PO CAPS
100.0000 mg | ORAL_CAPSULE | Freq: Two times a day (BID) | ORAL | Status: DC
  Administered 2015-06-01: 100 mg via ORAL
  Filled 2015-06-01: qty 1

## 2015-06-01 MED ORDER — OXYCODONE-ACETAMINOPHEN 5-325 MG PO TABS
1.0000 | ORAL_TABLET | ORAL | Status: DC | PRN
  Administered 2015-06-01 – 2015-06-02 (×5): 1 via ORAL
  Filled 2015-06-01 (×5): qty 1

## 2015-06-01 MED ORDER — POLYSACCHARIDE IRON COMPLEX 150 MG PO CAPS
150.0000 mg | ORAL_CAPSULE | Freq: Every day | ORAL | Status: DC
  Administered 2015-06-02: 150 mg via ORAL
  Filled 2015-06-01 (×3): qty 1

## 2015-06-01 NOTE — Progress Notes (Signed)
PPD #1- SVD  Subjective:   Reports feeling well, sore Tolerating po/ No nausea or vomiting Bleeding is light Pain controlled with Motrin Up ad lib / ambulatory / voiding without problems Newborn: breastpumping / NICU / stable    Objective:   VS:  VS:  Filed Vitals:   05/31/15 2129 05/31/15 2230 06/01/15 0220 06/01/15 0535  BP: 114/69 111/65 100/72 115/71  Pulse: 84 98 83 70  Temp: 99.3 F (37.4 C) 97.5 F (36.4 C) 97.9 F (36.6 C) 98.2 F (36.8 C)  TempSrc: Oral Oral Oral Oral  Resp: 18 20 20 20   Height:      Weight:      SpO2: 100% 99% 100% 100%    LABS:  Recent Labs  05/31/15 1951 06/01/15 0512  WBC 20.8* 16.5*  HGB 10.8* 9.1*  PLT 108* 96*   Blood type: --/--/A POS, A POS (02/18 2104) Rubella: Immune (12/08 0000)   I&O: Intake/Output      02/19 0701 - 02/20 0700 02/20 0701 - 02/21 0700   Other     Total Intake(mL/kg)     Urine (mL/kg/hr) 600 (0.4)    Emesis/NG output 500 (0.3)    Blood 500 (0.3)    Total Output 1600     Net -1600            Physical Exam: Alert and oriented x3 Abdomen: soft, non-tender, non-distended  Fundus: firm, non-tender, U-3 Perineum: Well approximated, no significant erythema, edema, or drainage; healing well. Lochia: small Extremities: No edema, no calf pain or tenderness   Assessment:  PPD #1 G1P1001/ S/P:VAVD, 3rd degree laceration Chorioamnionitis, delivered-no evidence of acute infection  Gestational thrombocytopenia, delivered- stable ABL anemia  Plan: Discontinue Motrin, Start Percocet prn Repeat CBC in am Start Niferex Start Colace Continue routine post partum orders Anticipate D/C home tomorrow   Julianne Handler, N MSN, CNM 06/01/2015, 10:01 AM

## 2015-06-01 NOTE — Lactation Note (Signed)
This note was copied from a baby's chart. Lactation Consultation Note  Initial visit made.  Breastfeeding consultation services and support information and Providing breastmilk for your baby in NICU booklet given.  Mom has started pumping and obtaining drops.  Reviewed milk coming to volume.  Mom is getting ready to go to NICU and put baby to breast for the first time.  Encouraged to call for assist prn.  Patient Name: Linda Church Today's Date: 06/01/2015 Reason for consult: Initial assessment;NICU baby   Maternal Data    Feeding    LATCH Score/Interventions                      Lactation Tools Discussed/Used Pump Review: Setup, frequency, and cleaning;Milk Storage Initiated by:: RN Date initiated:: 05/31/15   Consult Status Consult Status: Follow-up Date: 06/02/15    Ave Filter 06/01/2015, 11:38 AM

## 2015-06-01 NOTE — Progress Notes (Signed)
CSW attempted to meet with MOB to introduce services, offer support, and complete assessment due to baby's admission to NICU, but she was not present at this time.  CSW will attempt again at a later time.

## 2015-06-01 NOTE — Anesthesia Postprocedure Evaluation (Signed)
Anesthesia Post Note  Patient: Linda Church  Procedure(s) Performed: * No procedures listed *  Patient location during evaluation: [facility] Anesthesia Type: Epidural Level of consciousness: awake Pain management: pain level controlled Vital Signs Assessment: post-procedure vital signs reviewed and stable Respiratory status: spontaneous breathing Cardiovascular status: stable Postop Assessment: no signs of nausea or vomiting and adequate PO intake Anesthetic complications: no    Last Vitals:  Filed Vitals:   06/01/15 0535 06/01/15 1141  BP: 115/71 98/53  Pulse: 70 68  Temp: 36.8 C 36.6 C  Resp: 20 18    Last Pain:  Filed Vitals:   06/01/15 1144  PainSc: 2                  Denetta Fei Hristova

## 2015-06-02 LAB — CBC
HCT: 24.7 % — ABNORMAL LOW (ref 36.0–46.0)
HEMOGLOBIN: 8.6 g/dL — AB (ref 12.0–15.0)
MCH: 30.3 pg (ref 26.0–34.0)
MCHC: 34.8 g/dL (ref 30.0–36.0)
MCV: 87 fL (ref 78.0–100.0)
Platelets: 106 10*3/uL — ABNORMAL LOW (ref 150–400)
RBC: 2.84 MIL/uL — AB (ref 3.87–5.11)
RDW: 14.5 % (ref 11.5–15.5)
WBC: 8.5 10*3/uL (ref 4.0–10.5)

## 2015-06-02 MED ORDER — POLYSACCHARIDE IRON COMPLEX 150 MG PO CAPS: 150.0000 mg | ORAL_CAPSULE | Freq: Every day | ORAL | Status: DC

## 2015-06-02 MED ORDER — HYDROCORTISONE ACE-PRAMOXINE 1-1 % RE CREA: 1.0000 "application " | TOPICAL_CREAM | Freq: Two times a day (BID) | RECTAL | Status: DC

## 2015-06-02 MED ORDER — DOCUSATE SODIUM 100 MG PO CAPS
200.0000 mg | ORAL_CAPSULE | Freq: Two times a day (BID) | ORAL | Status: DC
  Administered 2015-06-02: 200 mg via ORAL
  Filled 2015-06-02: qty 2

## 2015-06-02 MED ORDER — MAGNESIUM OXIDE 400 (241.3 MG) MG PO TABS
400.0000 mg | ORAL_TABLET | Freq: Every day | ORAL | Status: DC
  Administered 2015-06-02: 400 mg via ORAL
  Filled 2015-06-02 (×2): qty 1

## 2015-06-02 MED ORDER — OXYCODONE-ACETAMINOPHEN 5-325 MG PO TABS: 1.0000 | ORAL_TABLET | ORAL | Status: DC | PRN

## 2015-06-02 MED ORDER — MAGNESIUM OXIDE 400 (241.3 MG) MG PO TABS: 400.0000 mg | ORAL_TABLET | Freq: Every day | ORAL | Status: DC

## 2015-06-02 MED ORDER — DOCUSATE SODIUM 100 MG PO CAPS: 200.0000 mg | ORAL_CAPSULE | Freq: Two times a day (BID) | ORAL | Status: DC

## 2015-06-02 MED ORDER — VITAMIN D3 25 MCG (1000 UT) PO CAPS
2.0000 | ORAL_CAPSULE | Freq: Every day | ORAL | Status: DC
Start: 2015-06-02 — End: 2016-04-04

## 2015-06-02 NOTE — Progress Notes (Signed)
CSW attempted again to meet with parents who were not in MOB's room at this time.  CSW will attempt again at a later time.

## 2015-06-02 NOTE — Progress Notes (Signed)
PPD 2 SVD with 3rd degree repair  S:  Reports feeling well             Tolerating po/ No nausea or vomiting             Bleeding is light             Pain controlled with percocet             Up ad lib / ambulatory / voiding QS  Newborn stable in NICU - awaiting culture for DC plan / breast feeding - pumping  O:               VS: BP 104/70 mmHg  Pulse 77  Temp(Src) 98.1 F (36.7 C) (Oral)  Resp 14  Ht 5\' 3"  (1.6 m)  Wt 63.504 kg (140 lb)  BMI 24.81 kg/m2  SpO2 100%  Breastfeeding? Unknown   LABS:              Recent Labs  05/31/15 1951 06/01/15 0512  WBC 20.8* 16.5*  HGB 10.8* 9.1*  PLT 108* 96*               Blood type: --/--/A POS, A POS (02/18 2104)  Rubella: Immune (12/08 0000)                 tdap current               Physical Exam:             Alert and oriented X3  Abdomen: soft, non-tender, non-distended              Fundus: firm, non-tender, U-3  Perineum: no edema  Lochia: light  Extremities: no edema, no calf pain or tenderness    A: PPD # 2 with 3rd degree repair             IDA of pregnancy             Gestational thrombocytopenia - platelet less than 100 ( motrin hold x 96 hours)  Doing well - stable status  P: Routine post partum orders             DC home - [facility] booklet instructions reviewed                Ok to visit in NICU then DC to home ad lib this afternoon / evening pending bed availability    Artelia Laroche CNM, MSN, [facility] 06/02/2015, 8:33 AM

## 2015-06-02 NOTE — Discharge Summary (Signed)
Obstetric Discharge Summary  Reason for Admission: onset of labor - PPROM requiring augmentation Prenatal Procedures: iron supplementation for IDA of pregnancy / vegetarian (ate eggs and fish in pregnancy) Intrapartum Procedures: spontaneous vaginal delivery and epidural Postpartum Procedures: antibiotics for presumed chorioamnionitis secondary to prolonged ROM Complications-Operative and Postpartum: 3rd degree perineal laceration HEMOGLOBIN  Date Value Ref Range Status  06/01/2015 9.1* 12.0 - 15.0 g/dL Final   HCT  Date Value Ref Range Status  06/01/2015 26.0* 36.0 - 46.0 % Final    Physical Exam:  General: alert, cooperative and no distress Lochia: appropriate Uterine Fundus: firm Incision: healing well DVT Evaluation: No evidence of DVT seen on physical exam.  Discharge Diagnoses: Term Pregnancy-delivered  Discharge Information: Date: 06/02/2015 Activity: pelvic rest Diet: routine Medications: PNV, Colace, Iron, Percocet and magnesium and wellbutrin and lexapro Condition: stable Instructions: refer to practice specific booklet / extensive review of instructions for 3rd degree care - diet & medication prevention Discharge to: home Follow-up Information    Follow up with Artelia Laroche, CNM. Schedule an appointment as soon as possible for a visit in 6 weeks.   Specialty:  Obstetrics and Gynecology   Contact information:   [place] [address] [phone]       Newborn Data: Live born female  Birth Weight: 7 lb 1.9 oz (3230 g) APGAR: 1, 3 - NICU admission  Artelia Laroche 06/02/2015, 10:33 AM

## 2015-06-02 NOTE — Lactation Note (Signed)
This note was copied from a baby's chart. Lactation Consultation Note  Assisted with breastfeeding baby in NICU.  Baby showing feeding cues, opened wide and latched easily.  Mom shown how to widen latch and untuck bottom lip with gentle chin tug.  Baby actively nursed with some pulling back at times.  Encouraged mom to use alternate breast massage to increase milk flow and intake.  Instructed to continue pumping and hand expression until milk is well established and baby is effectively feeding.  She has rented a hospital grade pump from a private source.  Mom is motivated and open to teaching.  Patient Name: Linda Church [id number] Date: 06/02/2015 Reason for consult: Follow-up assessment;NICU baby   Maternal Data    Feeding Feeding Type: Breast Fed Nipple Type: Slow - flow Length of feed: 3 min  LATCH Score/Interventions Latch: Grasps breast easily, tongue down, lips flanged, rhythmical sucking.  Audible Swallowing: A few with stimulation Intervention(s): Skin to skin;Hand expression;Alternate breast massage  Type of Nipple: Everted at rest and after stimulation  Comfort (Breast/Nipple): Soft / non-tender     Hold (Positioning): No assistance needed to correctly position infant at breast. Intervention(s): Breastfeeding basics reviewed;Support Pillows;Position options;Skin to skin  LATCH Score: 9  Lactation Tools Discussed/Used     Consult Status Consult Status: PRN    Ave Filter 06/02/2015, 2:47 PM

## 2015-06-02 NOTE — Progress Notes (Signed)
Patient discharged home with family... Discharge instructions reviewed with patient and she verbalized understanding... Condition stable... No equipment... Patient plans to visit with baby in NICU for a while and will discharge home from there.

## 2015-06-03 ENCOUNTER — Ambulatory Visit: Payer: Self-pay

## 2015-06-03 NOTE — Lactation Note (Addendum)
This note was copied from a baby's chart. Lactation Consultation Note  Patient Name: Linda Church Today's Date: 06/03/2015    Called to NICU to consult with MOB's partner who is in the process of inducing lactation. Partner is not planning to put baby to breast until baby 19 months old d/t baby withdrawing from MOB's medications: Wellbutrin (L3) and Lexapro (L2) per [facility] "Medications and Mother's Milk," and in order for MOB's milk to come to volume. Partner asking about the benefits of offering STS and nuzzling at the breast when MOB cannot be at bedside. Discussed the benefits of STS and nuzzling at breast for baby and for assisting with lactation induction. Partner also aware of SNS system as needed.   MOB stated that her milk is transitioning to mature milk/becoming thinner and more white in color, and increasing in amount. MOB had no additional questions.       Maternal Data    Feeding Feeding Type: Breast Milk with Formula added Nipple Type: Slow - flow  LATCH Score/Interventions Latch: Repeated attempts needed to sustain latch, nipple held in mouth throughout feeding, stimulation needed to elicit sucking reflex.  Audible Swallowing: A few with stimulation Intervention(s): Skin to skin  Type of Nipple: Everted at rest and after stimulation  Comfort (Breast/Nipple): Soft / non-tender     Hold (Positioning): No assistance needed to correctly position infant at breast.  [facility] Score: 8  Lactation Tools Discussed/Used     Consult Status      Inocente Salles 06/03/2015, 12:14 PM

## 2015-06-04 ENCOUNTER — Ambulatory Visit: Payer: Self-pay

## 2015-06-04 NOTE — Lactation Note (Addendum)
This note was copied from a baby's chart. Lactation Consultation Note  Patient Name: Girl Shirley Oleksiak [id number] Date: 06/04/2015 Reason for consult: Follow-up assessment;NICU baby  NICU baby 29 hours old. Called to NICU to assist MOB with breast engorgement. Mom states that her EBM have increased, she is getting 25-30 ml from right breast. However, her left breast is so engorged that she has only been getting a few drops today. Assisted mom to get right breast flowing by applying ice pack and using manual pump and hand express. Enc mom to pump every 2-3 hours and use the same process as demonstrated to get milk flowing. Mom able to use DEBP after getting milk to flow with manual pump. Discussed how to convert a sports bra for hands-free pumping and enc mom to massage and hand express any remaining tight/hard areas of breast to prevent breast milk stasis. Maternal Data Has patient been taught Hand Expression?: Yes Does the patient have breastfeeding experience prior to this delivery?: No  Feeding Feeding Type: Breast Milk Nipple Type: Slow - flow Length of feed: 40 min  LATCH Score/Interventions                      Lactation Tools Discussed/Used     Consult Status Consult Status: PRN    Inocente Salles 06/04/2015, 12:31 PM

## 2015-06-08 ENCOUNTER — Ambulatory Visit: Payer: Self-pay

## 2015-06-08 NOTE — Lactation Note (Signed)
This note was copied from a baby's chart. Lactation Consultation Note  Patient Name: Linda Church [id number] Date: 06/08/2015 Reason for consult: Follow-up assessment;NICU baby NICU baby 23 days old. MOB and partner rooming in with baby last night and report that baby often sleepy at breast. Discussed that baby used to the flow of the bottle and that it will take time for the baby to be exclusively at breast. Enc putting baby to breast first with each feeding, then supplementing with EBM/formula, and then post-pumping followed by hand expression. Discussed the need to keep pumping to protect supply. MOB reports that baby latches well, but isn't alert for long. Discussed methods of stimulating baby to nurse, and also discussed how to gradually move away from supplementing and how to know that baby satiated. MOB and partner aware of OP/BFSG and [place] phone line assistance after D/C.   Maternal Data    Feeding    LATCH Score/Interventions                      Lactation Tools Discussed/Used     Consult Status Consult Status: PRN    Inocente Salles 06/08/2015, 10:35 AM

## 2015-07-21 DIAGNOSIS — F419 Anxiety disorder, unspecified: Secondary | ICD-10-CM | POA: Diagnosis not present

## 2015-07-21 DIAGNOSIS — F330 Major depressive disorder, recurrent, mild: Secondary | ICD-10-CM | POA: Diagnosis not present

## 2015-08-12 DIAGNOSIS — F330 Major depressive disorder, recurrent, mild: Secondary | ICD-10-CM | POA: Diagnosis not present

## 2015-08-26 DIAGNOSIS — F330 Major depressive disorder, recurrent, mild: Secondary | ICD-10-CM | POA: Diagnosis not present

## 2015-08-26 DIAGNOSIS — F419 Anxiety disorder, unspecified: Secondary | ICD-10-CM | POA: Diagnosis not present

## 2015-09-22 DIAGNOSIS — F330 Major depressive disorder, recurrent, mild: Secondary | ICD-10-CM | POA: Diagnosis not present

## 2015-09-22 DIAGNOSIS — F419 Anxiety disorder, unspecified: Secondary | ICD-10-CM | POA: Diagnosis not present

## 2015-09-23 DIAGNOSIS — Z6821 Body mass index (BMI) 21.0-21.9, adult: Secondary | ICD-10-CM | POA: Diagnosis not present

## 2015-09-23 DIAGNOSIS — F411 Generalized anxiety disorder: Secondary | ICD-10-CM | POA: Diagnosis not present

## 2015-09-23 DIAGNOSIS — O99345 Other mental disorders complicating the puerperium: Secondary | ICD-10-CM | POA: Diagnosis not present

## 2015-11-26 DIAGNOSIS — F330 Major depressive disorder, recurrent, mild: Secondary | ICD-10-CM | POA: Diagnosis not present

## 2015-11-26 DIAGNOSIS — F419 Anxiety disorder, unspecified: Secondary | ICD-10-CM | POA: Diagnosis not present

## 2015-12-03 DIAGNOSIS — F330 Major depressive disorder, recurrent, mild: Secondary | ICD-10-CM | POA: Diagnosis not present

## 2015-12-10 DIAGNOSIS — F330 Major depressive disorder, recurrent, mild: Secondary | ICD-10-CM | POA: Diagnosis not present

## 2015-12-10 DIAGNOSIS — E559 Vitamin D deficiency, unspecified: Secondary | ICD-10-CM | POA: Diagnosis not present

## 2015-12-10 DIAGNOSIS — F419 Anxiety disorder, unspecified: Secondary | ICD-10-CM | POA: Diagnosis not present

## 2015-12-10 DIAGNOSIS — Z0001 Encounter for general adult medical examination with abnormal findings: Secondary | ICD-10-CM | POA: Diagnosis not present

## 2015-12-17 DIAGNOSIS — F419 Anxiety disorder, unspecified: Secondary | ICD-10-CM | POA: Diagnosis not present

## 2015-12-17 DIAGNOSIS — F330 Major depressive disorder, recurrent, mild: Secondary | ICD-10-CM | POA: Diagnosis not present

## 2015-12-17 DIAGNOSIS — Z0000 Encounter for general adult medical examination without abnormal findings: Secondary | ICD-10-CM | POA: Diagnosis not present

## 2015-12-17 DIAGNOSIS — Z23 Encounter for immunization: Secondary | ICD-10-CM | POA: Diagnosis not present

## 2016-01-01 DIAGNOSIS — F330 Major depressive disorder, recurrent, mild: Secondary | ICD-10-CM | POA: Diagnosis not present

## 2016-01-01 DIAGNOSIS — F419 Anxiety disorder, unspecified: Secondary | ICD-10-CM | POA: Diagnosis not present

## 2016-01-07 DIAGNOSIS — F330 Major depressive disorder, recurrent, mild: Secondary | ICD-10-CM | POA: Diagnosis not present

## 2016-01-07 DIAGNOSIS — F419 Anxiety disorder, unspecified: Secondary | ICD-10-CM | POA: Diagnosis not present

## 2016-01-21 DIAGNOSIS — F330 Major depressive disorder, recurrent, mild: Secondary | ICD-10-CM | POA: Diagnosis not present

## 2016-01-21 DIAGNOSIS — F419 Anxiety disorder, unspecified: Secondary | ICD-10-CM | POA: Diagnosis not present

## 2016-02-04 DIAGNOSIS — F330 Major depressive disorder, recurrent, mild: Secondary | ICD-10-CM | POA: Diagnosis not present

## 2016-02-04 DIAGNOSIS — F419 Anxiety disorder, unspecified: Secondary | ICD-10-CM | POA: Diagnosis not present

## 2016-04-04 ENCOUNTER — Inpatient Hospital Stay (HOSPITAL_COMMUNITY)
Admission: AD | Admit: 2016-04-04 | Discharge: 2016-04-04 | Disposition: A | Discharge status: Home / Self Care | Payer: BLUE CROSS/BLUE SHIELD | Source: Ambulatory Visit | Attending: Obstetrics & Gynecology | Admitting: Obstetrics & Gynecology

## 2016-04-04 ENCOUNTER — Encounter (HOSPITAL_COMMUNITY): Payer: Self-pay | Admitting: *Deleted

## 2016-04-04 DIAGNOSIS — N61 Mastitis without abscess: Secondary | ICD-10-CM | POA: Diagnosis not present

## 2016-04-04 DIAGNOSIS — Z79899 Other long term (current) drug therapy: Secondary | ICD-10-CM | POA: Insufficient documentation

## 2016-04-04 DIAGNOSIS — R509 Fever, unspecified: Secondary | ICD-10-CM | POA: Diagnosis not present

## 2016-04-04 DIAGNOSIS — F329 Major depressive disorder, single episode, unspecified: Secondary | ICD-10-CM | POA: Diagnosis not present

## 2016-04-04 DIAGNOSIS — R05 Cough: Secondary | ICD-10-CM | POA: Diagnosis not present

## 2016-04-04 DIAGNOSIS — F419 Anxiety disorder, unspecified: Secondary | ICD-10-CM | POA: Diagnosis not present

## 2016-04-04 LAB — INFLUENZA PANEL BY PCR (TYPE A & B)
Influenza A By PCR: POSITIVE — AB
Influenza B By PCR: NEGATIVE

## 2016-04-04 MED ORDER — OSELTAMIVIR PHOSPHATE 75 MG PO CAPS: 75.0000 mg | ORAL_CAPSULE | Freq: Two times a day (BID) | ORAL | 0 refills | Status: AC

## 2016-04-04 NOTE — MAU Provider Note (Signed)
History     CSN: [id number]  Arrival date and time: 04/04/16 1454 Seen by provider at 1525    Chief Complaint  Patient presents with  . Chills  . Fever   HPI Linda Church 40 y.o.  Comes to MAU with mastitis in left breast.  She began antibiotics on Friday night and initially felt better but today has had more fever 100.6 and body aches.  Is worried and does not want to become septic so she came in for evaluation.  Taking Dicloxicillin.  Additionally has upper respiratory symptoms since Tuesday.  Is continuing to have congestion and cough.  OB History    Gravida Para Term Preterm AB Living   1 1 1     1    SAB TAB Ectopic Multiple Live Births         0 1      Past Medical History:  Diagnosis Date  . Anxiety   . Depression   . Gestational thrombocytopenia without hemorrhage in third trimester (Angie) 05/30/2015  . Indication for care in labor or delivery 05/30/2015    Past Surgical History:  Procedure Laterality Date  . NO PAST SURGERIES      History reviewed. No pertinent family history.  Social History  Substance Use Topics  . Smoking status: Never Smoker  . Smokeless tobacco: Current User  . Alcohol use 0.0 oz/week    Allergies: No Known Allergies  Prescriptions Prior to Admission  Medication Sig Dispense Refill Last Dose  . acetaminophen (TYLENOL) 500 MG tablet Take 1,000 mg by mouth every 6 (six) hours as needed for moderate pain.   04/04/2016 at Unknown time  . buPROPion (WELLBUTRIN XL) 300 MG 24 hr tablet Take 300 mg by mouth daily.   04/04/2016 at Unknown time  . dicloxacillin (DYNAPEN) 250 MG capsule Take 250 mg by mouth 4 (four) times daily.   04/04/2016 at Unknown time  . escitalopram (LEXAPRO) 20 MG tablet Take 20 mg by mouth daily.   04/04/2016 at Unknown time  . fluconazole (DIFLUCAN) 200 MG tablet Take 200 mg by mouth daily.   04/04/2016 at Unknown time  . ibuprofen (ADVIL,MOTRIN) 200 MG tablet Take 600 mg by mouth every 6 (six) hours as needed  for moderate pain.   04/04/2016 at Unknown time  . Cholecalciferol (VITAMIN D3) 1000 units CAPS Take 2 capsules (2,000 Units total) by mouth daily. (Patient not taking: Reported on 04/04/2016) 60 capsule 5 Not Taking at Unknown time  . docusate sodium (COLACE) 100 MG capsule Take 2 capsules (200 mg total) by mouth 2 (two) times daily. (Patient not taking: Reported on 04/04/2016) 10 capsule 0 Not Taking at Unknown time  . iron polysaccharides (NIFEREX) 150 MG capsule Take 1 capsule (150 mg total) by mouth daily. (Patient not taking: Reported on 04/04/2016) 60 capsule 0 Not Taking at Unknown time  . magnesium oxide (MAG-OX) 400 (241.3 Mg) MG tablet Take 1 tablet (400 mg total) by mouth daily. (Patient not taking: Reported on 04/04/2016) 90 tablet 4 Not Taking at Unknown time  . oxyCODONE-acetaminophen (PERCOCET/ROXICET) 5-325 MG tablet Take 1 tablet by mouth every 4 (four) hours as needed for moderate pain. (Patient not taking: Reported on 04/04/2016) 15 tablet 0 Not Taking at Unknown time  . pramoxine-hydrocortisone (PROCTOCREAM-HC) 1-1 % rectal cream Place 1 application rectally 2 (two) times daily. External use only (Patient not taking: Reported on 04/04/2016) 30 g 0 Not Taking at Unknown time    Review of Systems  Constitutional: Positive for  fever.  Genitourinary:       Left breast does not drain completely Pinpoint redness on left nipple Redness of left breast   Physical Exam   Blood pressure 108/74, pulse 89, temperature 98.3 F (36.8 C), temperature source Oral, resp. rate 16, unknown if currently breastfeeding.  Physical Exam  Nursing note and vitals reviewed. Constitutional: She is oriented to person, place, and time. She appears well-developed and well-nourished.  HENT:  Head: Normocephalic.  Eyes: EOM are normal.  Neck: Neck supple.  Respiratory:  Congested cough noted Breast exam - left breast has diffuse redness from 10 -2 o'clock and also some redness noted at 9 o'clock.   Able to palpate soft duct above the areola approx 3 cm in diameter.  Is not tender to touch.  Pinpoint red dot seen on nipple.  Musculoskeletal: Normal range of motion.  Neurological: She is alert and oriented to person, place, and time.  Skin: Skin is warm and dry.  Psychiatric: She has a normal mood and affect.    MAU Course  Procedures  MDM Discussed importance of keeping the breast well drained.  She has been pumping every 3 hours at home.  Is considering weaning her child.  Has not pumped this afternoon in 4 hours.  Will arrange to pump now.  1620  Discussed symptoms and plan of care with Dr. Benjie Karvonen.  Will get flu swab today.  No fever here today although took Ibuprofen this afternoon.  Is still taking dicloxicillin.    Did use ice to the left breast and pumped. Got one ounce of breastmilk from left breast and 3 ounces from right breast.  Used hand expression to left breast and unable to reduce the collection of mild palpated in duct on left breast.  Had some thickened orange material expressed from left nipple.  Visualizing the breast after pumping - redness on the breast has resolved (at least temporarily).  Client able to hand express from the left breast - breast has improved in tenderness since beginning the antibiotics.  Assessment and Plan  Mastitis likely caused by a plugged mild duct - currently on antibiotics Fever - unsure of cause of fever - from mastitis or from another cause - URI (Did flu swab)  Plan Continue antibiotics, ibuprofen and tylenol for symptoms. Continue to use the pump and hand expression as you have been doing to get the duct in your breast to completely drain. Put the baby to breast whenever possible. Consider using a small piece of cabbage to the plugged duct site if desired - breast mild production may decrease on that breast. Call the office tomorrow if symptoms continue to worsen. Will contact you if the flu swab is positive.   After  Discharge: Flu swab was positive.  Prescribed Tamilflu to client's pharmacy.  RN to call client and tell her about the results and to pick up her medication.  Earlie Server, NP [place] 04/04/2016, 3:54 PM   Addendum- Pt d/w me on phone. Reviewed plan anf Flu test. MAU called me back with result, informed RN to call pt and start Tamiflu as well as inform Pediatrician in the morning and review Respiratory precautions  -V.Gayl Ivanoff MD

## 2016-04-04 NOTE — Discharge Instructions (Signed)
Continue antibiotics, ibuprofen and tylenol for symptoms. Continue to use the pump and hand expression as you have been doing to get the duct in your breast to completely drain. Put the baby to breast whenever possible. Consider using a small piece of cabbage to the plugged duct site if desired - breast mild production may decrease on that breast. Call the office tomorrow if symptoms continue to worsen. Will contact you if the flu swab is positive.

## 2016-04-04 NOTE — MAU Note (Signed)
Pt had a cold last week and a low grade fever then got mastitis and has been on antibiotics and alternating motrin and tylenol since Friday.  Woke up today feeling worse and is afraid she may have an abscess on left breast.

## 2016-04-07 DIAGNOSIS — F330 Major depressive disorder, recurrent, mild: Secondary | ICD-10-CM | POA: Diagnosis not present

## 2016-04-14 DIAGNOSIS — F330 Major depressive disorder, recurrent, mild: Secondary | ICD-10-CM | POA: Diagnosis not present

## 2016-04-21 DIAGNOSIS — R05 Cough: Secondary | ICD-10-CM | POA: Diagnosis not present

## 2016-04-21 DIAGNOSIS — R0789 Other chest pain: Secondary | ICD-10-CM | POA: Diagnosis not present

## 2016-05-05 DIAGNOSIS — F330 Major depressive disorder, recurrent, mild: Secondary | ICD-10-CM | POA: Diagnosis not present

## 2016-06-02 DIAGNOSIS — F330 Major depressive disorder, recurrent, mild: Secondary | ICD-10-CM | POA: Diagnosis not present

## 2016-06-23 DIAGNOSIS — F330 Major depressive disorder, recurrent, mild: Secondary | ICD-10-CM | POA: Diagnosis not present

## 2016-06-23 DIAGNOSIS — F419 Anxiety disorder, unspecified: Secondary | ICD-10-CM | POA: Diagnosis not present

## 2016-07-28 DIAGNOSIS — F330 Major depressive disorder, recurrent, mild: Secondary | ICD-10-CM | POA: Diagnosis not present

## 2016-09-12 IMAGING — RF DG HYSTEROGRAM
4 series · 4 of 4 positions shown · non-contrast
Comparison: None.

CLINICAL DATA: Infertility.

EXAM:
HYSTEROSALPINGOGRAM
TECHNIQUE: Hysterosalpingogram was performed by the ordering physician under
fluoroscopy. Fluoroscopic images were submitted for radiologic
interpretation following the procedure. Please see the procedural
report for the amount of contrast and the fluoroscopy time utilized.

[Series 1: run · 1 of 1 slices shown (1 of 4)]
[im 1/1]
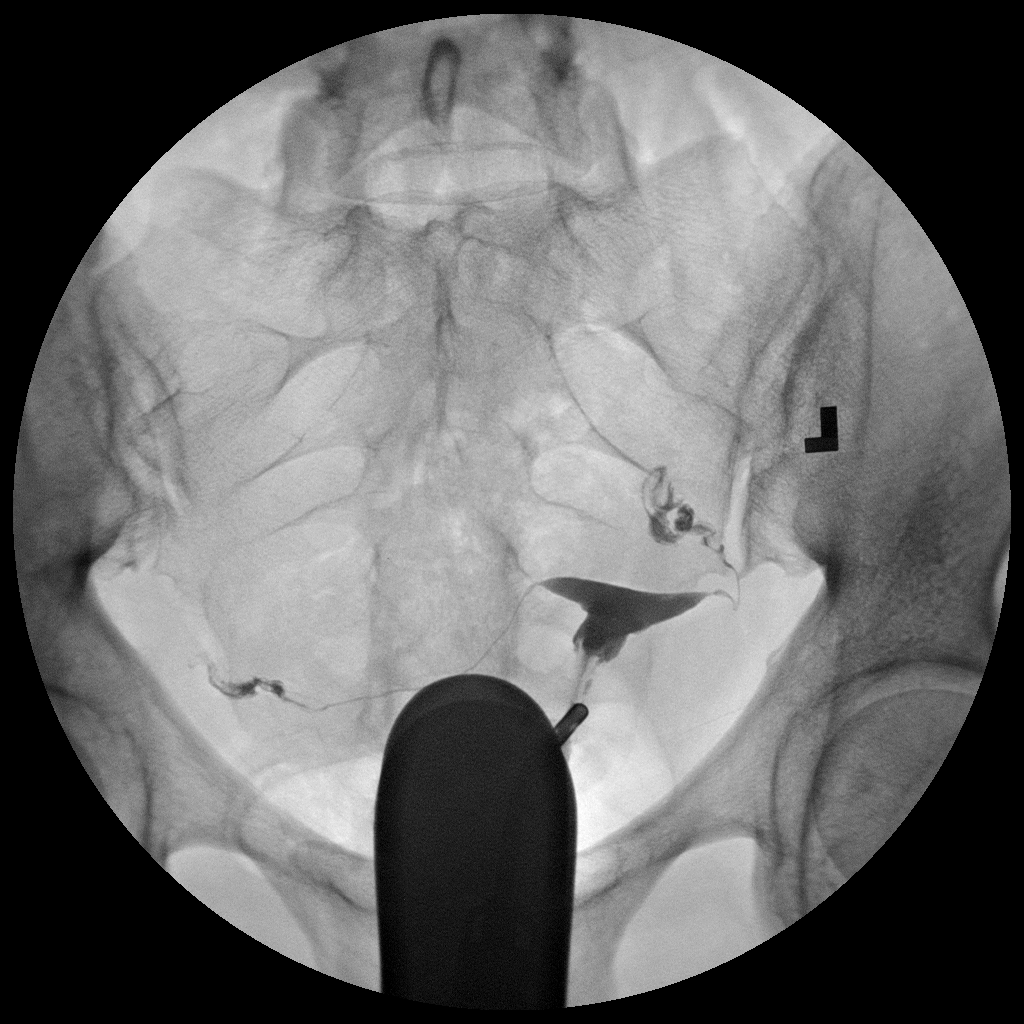

[Series 2: run · 1 of 1 slices shown (2 of 4)]
[im 1/1]
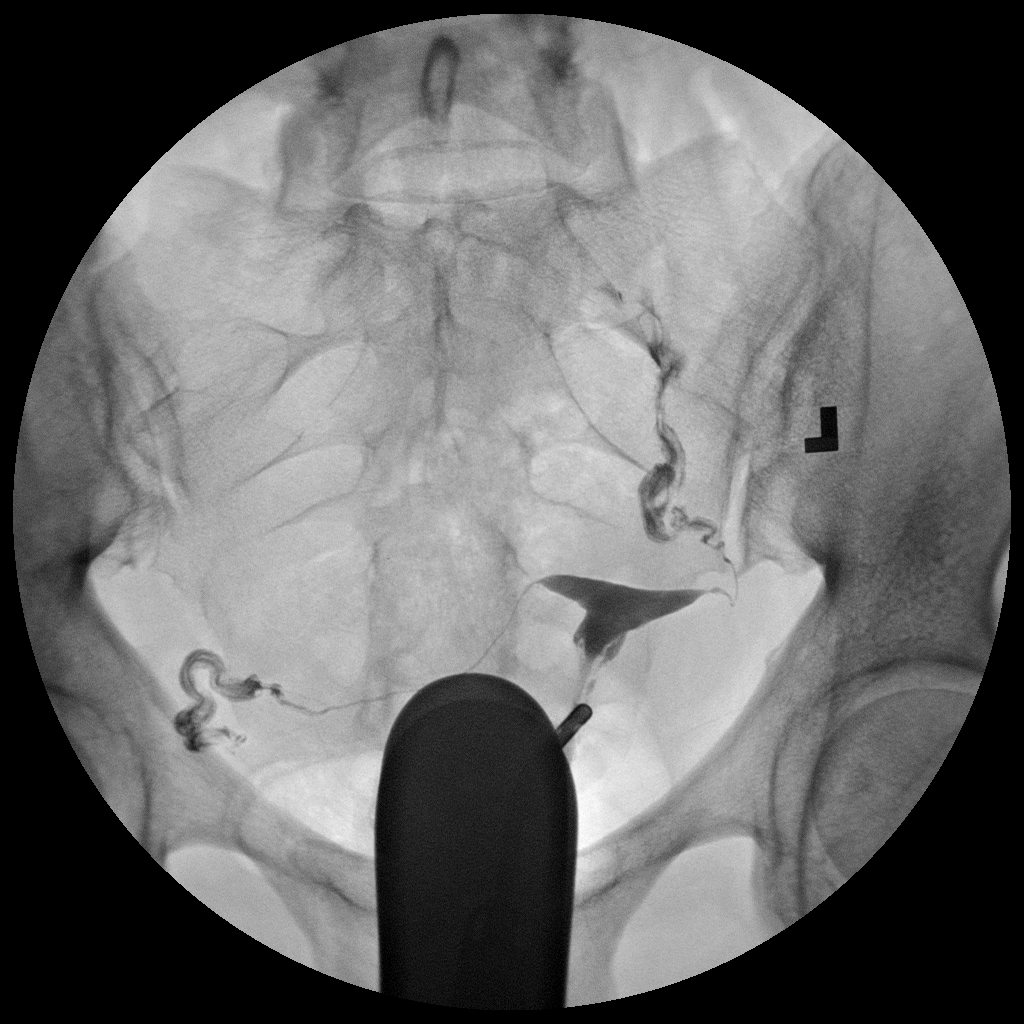

[Series 3: run · 1 of 1 slices shown (3 of 4)]
[im 1/1]
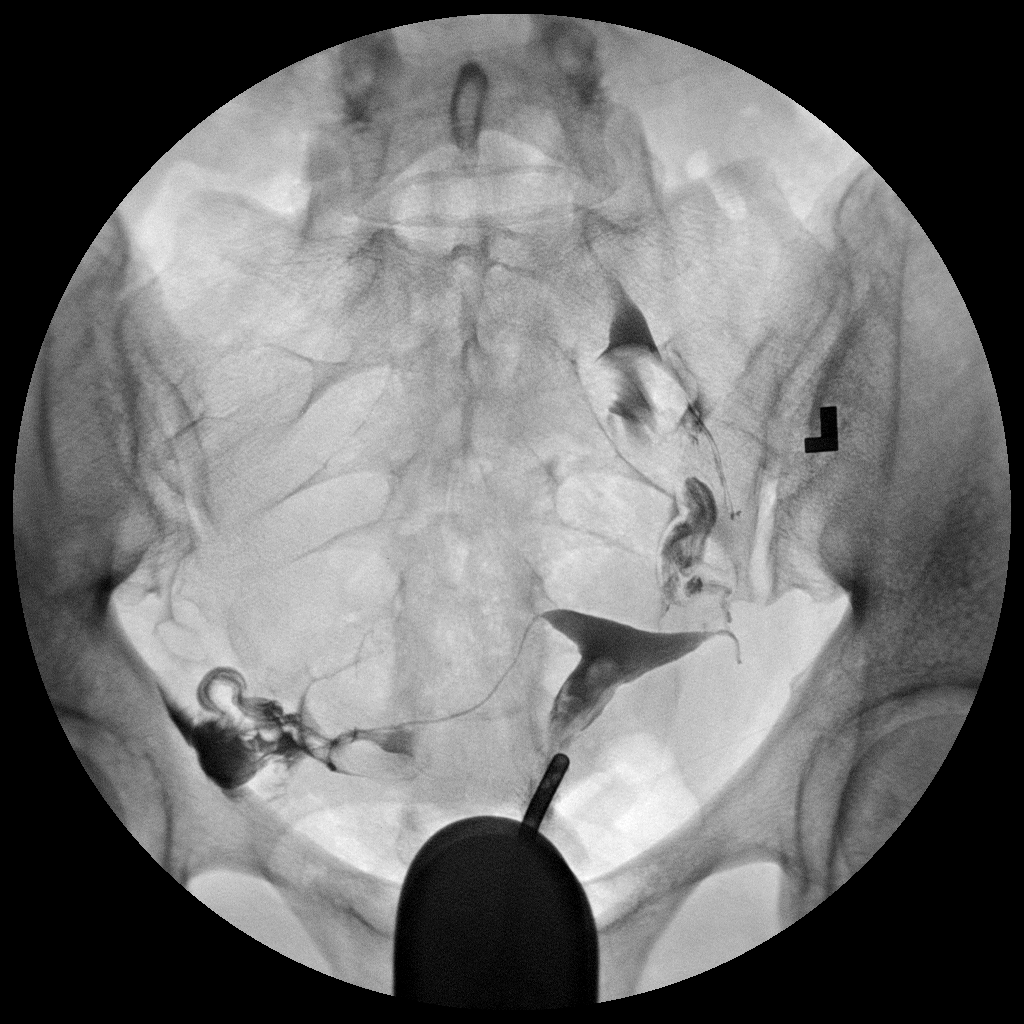

[Series 4: run · 1 of 1 slices shown (4 of 4)]
[im 1/1]
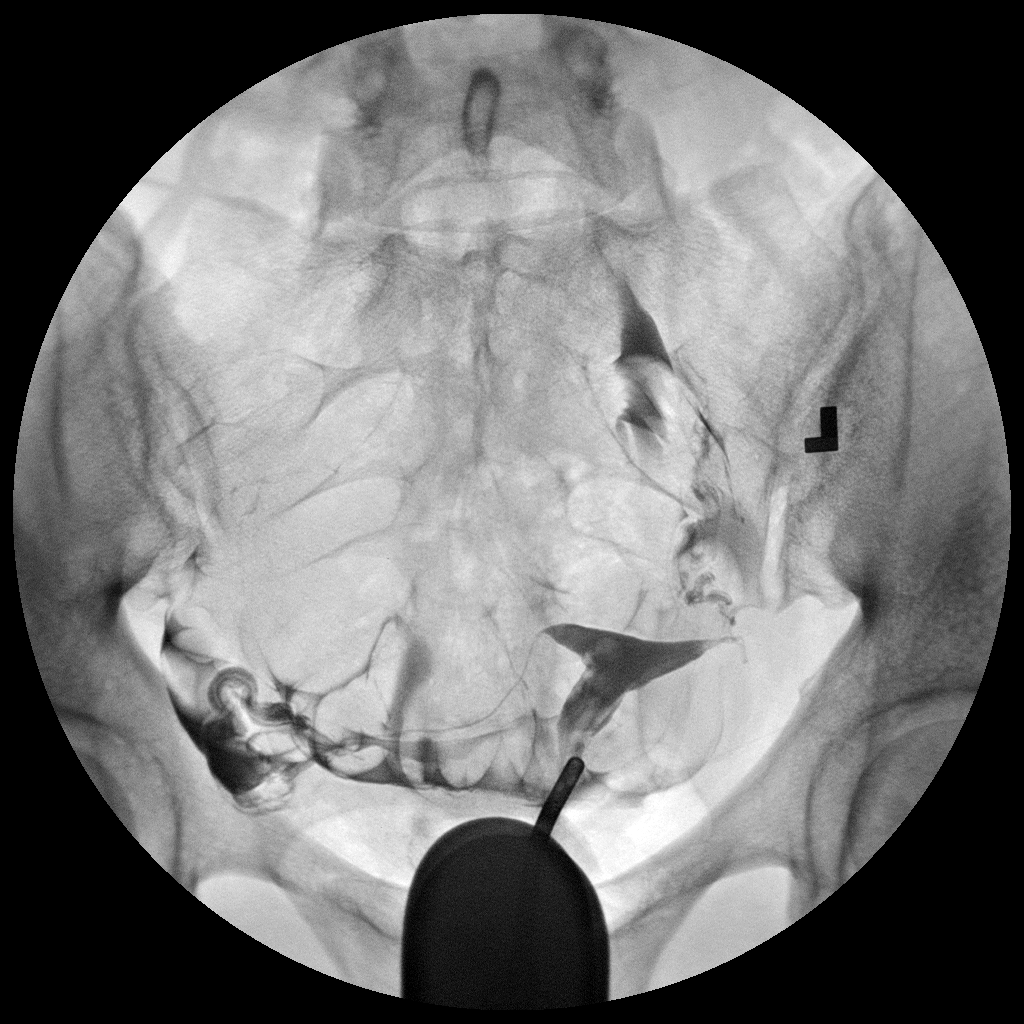

[4 of 4 positions shown; findings below may reference images not displayed]

FINDINGS: Endometrial cavity of the uterus is normal in contour. No evidence
of Mullerian duct anomaly.

Opacification of both fallopian tubes is seen. Both tubes are normal
in appearance. Intraperitoneal spill of contrast from both fallopian
tubes is demonstrated.
IMPRESSION: Normal study.  Both fallopian tubes are patent.

## 2016-11-15 DIAGNOSIS — F330 Major depressive disorder, recurrent, mild: Secondary | ICD-10-CM | POA: Diagnosis not present

## 2016-12-15 DIAGNOSIS — F330 Major depressive disorder, recurrent, mild: Secondary | ICD-10-CM | POA: Diagnosis not present

## 2016-12-16 DIAGNOSIS — Z0000 Encounter for general adult medical examination without abnormal findings: Secondary | ICD-10-CM | POA: Diagnosis not present

## 2016-12-23 DIAGNOSIS — N979 Female infertility, unspecified: Secondary | ICD-10-CM | POA: Diagnosis not present

## 2017-01-13 DIAGNOSIS — N979 Female infertility, unspecified: Secondary | ICD-10-CM | POA: Diagnosis not present

## 2017-01-23 DIAGNOSIS — N979 Female infertility, unspecified: Secondary | ICD-10-CM | POA: Diagnosis not present

## 2017-01-26 DIAGNOSIS — F418 Other specified anxiety disorders: Secondary | ICD-10-CM | POA: Diagnosis not present

## 2017-01-26 DIAGNOSIS — Z0000 Encounter for general adult medical examination without abnormal findings: Secondary | ICD-10-CM | POA: Diagnosis not present

## 2017-02-09 DIAGNOSIS — Z3201 Encounter for pregnancy test, result positive: Secondary | ICD-10-CM | POA: Diagnosis not present

## 2017-02-16 DIAGNOSIS — Z3201 Encounter for pregnancy test, result positive: Secondary | ICD-10-CM | POA: Diagnosis not present

## 2017-03-16 DIAGNOSIS — Z118 Encounter for screening for other infectious and parasitic diseases: Secondary | ICD-10-CM | POA: Diagnosis not present

## 2017-03-16 DIAGNOSIS — Z3A10 10 weeks gestation of pregnancy: Secondary | ICD-10-CM | POA: Diagnosis not present

## 2017-03-16 DIAGNOSIS — O09521 Supervision of elderly multigravida, first trimester: Secondary | ICD-10-CM | POA: Diagnosis not present

## 2017-03-16 DIAGNOSIS — Z3689 Encounter for other specified antenatal screening: Secondary | ICD-10-CM | POA: Diagnosis not present

## 2017-03-16 LAB — OB RESULTS CONSOLE GC/CHLAMYDIA
CHLAMYDIA, DNA PROBE: NEGATIVE
Gonorrhea: NEGATIVE

## 2017-03-16 LAB — OB RESULTS CONSOLE ABO/RH: RH TYPE: POSITIVE

## 2017-03-16 LAB — OB RESULTS CONSOLE ANTIBODY SCREEN: Antibody Screen: NEGATIVE

## 2017-03-16 LAB — OB RESULTS CONSOLE RPR: RPR: NONREACTIVE

## 2017-03-16 LAB — OB RESULTS CONSOLE HEPATITIS B SURFACE ANTIGEN: Hepatitis B Surface Ag: NEGATIVE

## 2017-03-16 LAB — OB RESULTS CONSOLE HIV ANTIBODY (ROUTINE TESTING): HIV: NONREACTIVE

## 2017-03-16 LAB — OB RESULTS CONSOLE RUBELLA ANTIBODY, IGM: Rubella: IMMUNE

## 2017-04-27 DIAGNOSIS — Z3A16 16 weeks gestation of pregnancy: Secondary | ICD-10-CM | POA: Diagnosis not present

## 2017-04-27 DIAGNOSIS — O09522 Supervision of elderly multigravida, second trimester: Secondary | ICD-10-CM | POA: Diagnosis not present

## 2017-05-04 DIAGNOSIS — F418 Other specified anxiety disorders: Secondary | ICD-10-CM | POA: Diagnosis not present

## 2017-05-04 DIAGNOSIS — O99345 Other mental disorders complicating the puerperium: Secondary | ICD-10-CM | POA: Diagnosis not present

## 2017-05-04 DIAGNOSIS — F411 Generalized anxiety disorder: Secondary | ICD-10-CM | POA: Diagnosis not present

## 2017-05-04 DIAGNOSIS — Z6821 Body mass index (BMI) 21.0-21.9, adult: Secondary | ICD-10-CM | POA: Diagnosis not present

## 2017-05-16 DIAGNOSIS — R6889 Other general symptoms and signs: Secondary | ICD-10-CM | POA: Diagnosis not present

## 2017-05-16 DIAGNOSIS — R509 Fever, unspecified: Secondary | ICD-10-CM | POA: Diagnosis not present

## 2017-05-16 DIAGNOSIS — J029 Acute pharyngitis, unspecified: Secondary | ICD-10-CM | POA: Diagnosis not present

## 2017-05-19 DIAGNOSIS — Z3A19 19 weeks gestation of pregnancy: Secondary | ICD-10-CM | POA: Diagnosis not present

## 2017-05-19 DIAGNOSIS — O09522 Supervision of elderly multigravida, second trimester: Secondary | ICD-10-CM | POA: Diagnosis not present

## 2017-05-25 DIAGNOSIS — F330 Major depressive disorder, recurrent, mild: Secondary | ICD-10-CM | POA: Diagnosis not present

## 2017-06-08 DIAGNOSIS — F330 Major depressive disorder, recurrent, mild: Secondary | ICD-10-CM | POA: Diagnosis not present

## 2017-06-22 DIAGNOSIS — F330 Major depressive disorder, recurrent, mild: Secondary | ICD-10-CM | POA: Diagnosis not present

## 2017-06-22 DIAGNOSIS — O09522 Supervision of elderly multigravida, second trimester: Secondary | ICD-10-CM | POA: Diagnosis not present

## 2017-06-22 DIAGNOSIS — Z3A24 24 weeks gestation of pregnancy: Secondary | ICD-10-CM | POA: Diagnosis not present

## 2017-07-06 DIAGNOSIS — F330 Major depressive disorder, recurrent, mild: Secondary | ICD-10-CM | POA: Diagnosis not present

## 2017-07-19 ENCOUNTER — Encounter (HOSPITAL_COMMUNITY): Payer: Self-pay | Admitting: *Deleted

## 2017-07-19 ENCOUNTER — Inpatient Hospital Stay (HOSPITAL_COMMUNITY)
Admission: AD | Admit: 2017-07-19 | Discharge: 2017-07-19 | Disposition: A | Discharge status: Home / Self Care | Payer: BLUE CROSS/BLUE SHIELD | Source: Ambulatory Visit | Attending: Obstetrics and Gynecology | Admitting: Obstetrics and Gynecology

## 2017-07-19 DIAGNOSIS — O4703 False labor before 37 completed weeks of gestation, third trimester: Secondary | ICD-10-CM | POA: Insufficient documentation

## 2017-07-19 DIAGNOSIS — Z3A28 28 weeks gestation of pregnancy: Secondary | ICD-10-CM | POA: Insufficient documentation

## 2017-07-19 DIAGNOSIS — O479 False labor, unspecified: Secondary | ICD-10-CM

## 2017-07-19 LAB — FETAL FIBRONECTIN: Fetal Fibronectin: NEGATIVE

## 2017-07-19 LAB — URINALYSIS, ROUTINE W REFLEX MICROSCOPIC
Bilirubin Urine: NEGATIVE
GLUCOSE, UA: NEGATIVE mg/dL
HGB URINE DIPSTICK: NEGATIVE
KETONES UR: NEGATIVE mg/dL
Leukocytes, UA: NEGATIVE
Nitrite: NEGATIVE
PH: 7 (ref 5.0–8.0)
PROTEIN: NEGATIVE mg/dL
Specific Gravity, Urine: 1.001 — ABNORMAL LOW (ref 1.005–1.030)

## 2017-07-19 MED ORDER — NIFEDIPINE 10 MG PO CAPS
10.0000 mg | ORAL_CAPSULE | Freq: Once | ORAL | Status: AC
Start: 2017-07-19 — End: 2017-07-19
  Administered 2017-07-19: 10 mg via ORAL
  Filled 2017-07-19: qty 1

## 2017-07-19 MED ORDER — LACTATED RINGERS IV BOLUS
1000.0000 mL | Freq: Once | INTRAVENOUS | Status: AC
  Administered 2017-07-19: 1000 mL via INTRAVENOUS

## 2017-07-19 MED ORDER — NIFEDIPINE 10 MG PO CAPS: 20.0000 mg | ORAL_CAPSULE | Freq: Once | ORAL | Status: DC

## 2017-07-19 MED ORDER — LACTATED RINGERS IV BOLUS
500.0000 mL | Freq: Once | INTRAVENOUS | Status: AC
  Administered 2017-07-19: 1000 mL via INTRAVENOUS

## 2017-07-19 MED ORDER — NIFEDIPINE 10 MG PO CAPS: 10.0000 mg | ORAL_CAPSULE | Freq: Four times a day (QID) | ORAL | 0 refills | Status: DC

## 2017-07-19 NOTE — MAU Provider Note (Signed)
History     CSN: [id number]  Arrival date and time: 07/19/17 1545   First Provider Initiated Contact with Patient 07/19/17 1636      Chief Complaint  Patient presents with  . Contractions   HPI   [name] is a 42 y.o. female G2P1001 @ [redacted]w[redacted]d here in MAU with contractions. Says she has been contracting since March however today around 10:50 she noticed the contractions were coming more frequent; every 3 mins. When she has a contraction she rates the pain 1/10. No leaking, no bleeding. + fetal movement.  No back pain.   OB History    Gravida  2   Para  1   Term  1   Preterm      AB      Living  1     SAB      TAB      Ectopic      Multiple  0   Live Births  1           Past Medical History:  Diagnosis Date  . Anxiety   . Depression   . Gestational thrombocytopenia without hemorrhage in third trimester ([place]) 05/30/2015  . Indication for care in labor or delivery 05/30/2015    Past Surgical History:  Procedure Laterality Date  . NO PAST SURGERIES      History reviewed. No pertinent family history.  Social History   Tobacco Use  . Smoking status: Never Smoker  . Smokeless tobacco: Current User  Substance Use Topics  . Alcohol use: Yes    Alcohol/week: 0.0 oz    Comment: Occasional  . Drug use: No    Allergies: No Known Allergies  Medications Prior to Admission  Medication Sig Dispense Refill Last Dose  . acetaminophen (TYLENOL) 500 MG tablet Take 1,000 mg by mouth every 6 (six) hours as needed for moderate pain.   04/04/2016 at Unknown time  . buPROPion (WELLBUTRIN XL) 300 MG 24 hr tablet Take 300 mg by mouth daily.   04/04/2016 at Unknown time  . dicloxacillin (DYNAPEN) 250 MG capsule Take 250 mg by mouth 4 (four) times daily.   04/04/2016 at Unknown time  . escitalopram (LEXAPRO) 20 MG tablet Take 20 mg by mouth daily.   04/04/2016 at Unknown time  . fluconazole (DIFLUCAN) 200 MG tablet Take 200 mg by mouth daily.   04/04/2016 at  Unknown time  . ibuprofen (ADVIL,MOTRIN) 200 MG tablet Take 600 mg by mouth every 6 (six) hours as needed for moderate pain.   04/04/2016 at Unknown time   Results for orders placed or performed during the hospital encounter of 07/19/17 (from the past 48 hour(s))  Urinalysis, Routine w reflex microscopic     Status: Abnormal   Collection Time: 07/19/17  3:51 PM  Result Value Ref Range   Color, Urine STRAW (A) YELLOW   APPearance CLEAR CLEAR   Specific Gravity, Urine 1.001 (L) 1.005 - 1.030   pH 7.0 5.0 - 8.0   Glucose, UA NEGATIVE NEGATIVE mg/dL   Hgb urine dipstick NEGATIVE NEGATIVE   Bilirubin Urine NEGATIVE NEGATIVE   Ketones, ur NEGATIVE NEGATIVE mg/dL   Protein, ur NEGATIVE NEGATIVE mg/dL   Nitrite NEGATIVE NEGATIVE   Leukocytes, UA NEGATIVE NEGATIVE    Comment: Performed at [facility], [address] [place], [place] [address]   Review of Systems  Constitutional: Negative for fever.  Gastrointestinal: Positive for abdominal pain (1/10 contraction pain ).  Genitourinary: Negative for dysuria.  Physical Exam   Blood pressure (!) 100/56, pulse 73, temperature 98.6 F (37 C), temperature source Oral, resp. rate 16, height 5\' 4"  (1.626 m), weight 136 lb (61.7 kg), SpO2 99 %, unknown if currently breastfeeding.  Physical Exam  Constitutional: She is oriented to person, place, and time. She appears well-developed and well-nourished. No distress.  HENT:  Head: Normocephalic.  Eyes: Pupils are equal, round, and reactive to light.  Neck: Neck supple.  GI: Soft. She exhibits no distension. There is no tenderness. There is no rebound and no guarding.  Genitourinary:  Genitourinary Comments: Cervix: Closed, thick, posterior  FFN collected   Musculoskeletal: Normal range of motion.  Neurological: She is alert and oriented to person, place, and time.  Skin: Skin is warm. She is not diaphoretic.  Psychiatric: Her behavior is normal.   Fetal Tracing: Baseline: 125  bpm Variability: Moderate  Accelerations: 10x10 Decelerations: None Toco: Initially Q3-4 mins then quiet following procardia.   MAU Course  Procedures  None  MDM  LR bolus X1 Procardia 10 mg PO X 1 Fetal fibronectin collected and sent.   1915: Patient feeling much better, contractions feel less intense and spaced out. Discussed with Dr. Murrell Redden; ok to DC home.   Assessment and Plan   A:  1. Threatened premature labor in third trimester   2. Braxton Hicks contractions      P:  Discharge home with strict return precautions Preterm labor precautions. Rx: procardia Q6 Follow up on Friday in the office as scheduled Fetal fibronectin pending due to machine down: if positive will call patient and likely recommend antenatal steroids Pelvic rest  Rasch, Artist Pais, NP 07/19/2017 7:37 PM

## 2017-07-19 NOTE — MAU Note (Signed)
Pt reports braxton hicks contractions off/on for a few weeks, today they are q 2-3 minutes, more painful. Denies bleeding or ROM

## 2017-07-19 NOTE — Discharge Instructions (Signed)
Fetal Fibronectin Fetal fibronectin (fFN) is a protein that your body produces during pregnancy. This protein is normally found in your vaginal fluid in early pregnancy and just before delivery. It should not be there between 22 and 35 weeks of pregnancy. Having fFN in your vagina between 22 and 35 weeks could be a warning sign that your baby will be born early (prematurely). Babies born prematurely, or before 64 weeks, may have trouble breathing or feeding. A negative fFN test between 22 and 35 weeks means that it is unlikely you will have a premature delivery in the next 2 weeks. You may have this test if you have symptoms of premature labor. These include:  Contractions.  Increased vaginal discharge.  Backache.  If there is a chance of preterm labor and delivery, your health care provider will monitor you carefully and take steps to delay your labor if necessary. This test requires a sample of fluid from inside your vagina. Your health care provider collects this sample using a cotton swab. How do I prepare for this test?  Ask your health care provider if: ? You need to avoid using lubricants or douches before this exam. ? You need to avoid sexual intercourse for 24 hours before the exam.  Tell your health care provider if you have a vaginal yeast infection or any symptoms of a yeast infection: ? Itching. ? Soreness. ? Discharge. What do the results mean? It is your responsibility to obtain your test results. Ask the lab or department performing the test when and how you will get your results. Contact your health care provider to discuss any questions you have about your results. The results of this test will be positive or negative. Meaning of Negative Test Results A negative result means no fFN was found in your vaginal fluid. A negative result means that there is very little chance you will go into labor in the next two weeks. You may have this test again in two weeks if you are still  having symptoms of early labor. Meaning of Positive Test Results A positive result means fFN was found in your vaginal fluid. A positive result does not mean you will go into early labor. It does mean your risk is greater. Your health care provider may do other tests and exams to closely follow your pregnancy. Talk with your health care provider to discuss your results, treatment options, and if necessary, the need for more tests. Talk with your health care provider if you have any questions about your results. This information is not intended to replace advice given to you by your health care provider. Make sure you discuss any questions you have with your health care provider. Document Released: 01/28/2004 Document Revised: 12/01/2015 Document Reviewed: 06/25/2013 Elsevier Interactive Patient Education  2018 [employer].   Preterm Labor and Birth Information Pregnancy normally lasts 39-41 weeks. Preterm labor is when labor starts early. It starts before you have been pregnant for 37 whole weeks. What are the risk factors for preterm labor? Preterm labor is more likely to occur in women who:  Have an infection while pregnant.  Have a cervix that is short.  Have gone into preterm labor before.  Have had surgery on their cervix.  Are younger than age 13.  Are older than age 39.  Are African American.  Are pregnant with two or more babies.  Take street drugs while pregnant.  Smoke while pregnant.  Do not gain enough weight while pregnant.  Got pregnant  right after another pregnancy.  What are the symptoms of preterm labor? Symptoms of preterm labor include:  Cramps. The cramps may feel like the cramps some women get during their period. The cramps may happen with watery poop (diarrhea).  Pain in the belly (abdomen).  Pain in the lower back.  Regular contractions or tightening. It may feel like your belly is getting tighter.  Pressure in the lower belly that seems to  get stronger.  More fluid (discharge) leaking from the vagina. The fluid may be watery or bloody.  Water breaking.  Why is it important to notice signs of preterm labor? Babies who are born early may not be fully developed. They have a higher chance for:  Long-term heart problems.  Long-term lung problems.  Trouble controlling body systems, like breathing.  Bleeding in the brain.  A condition called cerebral palsy.  Learning difficulties.  Death.  These risks are highest for babies who are born before 95 weeks of pregnancy. How is preterm labor treated? Treatment depends on:  How long you were pregnant.  Your condition.  The health of your baby.  Treatment may involve:  Having a stitch (suture) placed in your cervix. When you give birth, your cervix opens so the baby can come out. The stitch keeps the cervix from opening too soon.  Staying at the hospital.  Taking or getting medicines, such as: ? Hormone medicines. ? Medicines to stop contractions. ? Medicines to help the babys lungs develop. ? Medicines to prevent your baby from having cerebral palsy.  What should I do if I am in preterm labor? If you think you are going into labor too soon, call your doctor right away. How can I prevent preterm labor?  Do not use any tobacco products. ? Examples of these are cigarettes, chewing tobacco, and e-cigarettes. ? If you need help quitting, ask your doctor.  Do not use street drugs.  Do not use any medicines unless you ask your doctor if they are safe for you.  Talk with your doctor before taking any herbal supplements.  Make sure you gain enough weight.  Watch for infection. If you think you might have an infection, get it checked right away.  If you have gone into preterm labor before, tell your doctor. This information is not intended to replace advice given to you by your health care provider. Make sure you discuss any questions you have with your health  care provider. Document Released: 06/24/2008 Document Revised: 09/08/2015 Document Reviewed: 08/19/2015 Elsevier Interactive Patient Education  2018 [employer].

## 2017-07-20 DIAGNOSIS — F330 Major depressive disorder, recurrent, mild: Secondary | ICD-10-CM | POA: Diagnosis not present

## 2017-07-21 DIAGNOSIS — Z3689 Encounter for other specified antenatal screening: Secondary | ICD-10-CM | POA: Diagnosis not present

## 2017-07-21 DIAGNOSIS — O4703 False labor before 37 completed weeks of gestation, third trimester: Secondary | ICD-10-CM | POA: Diagnosis not present

## 2017-07-21 DIAGNOSIS — Z23 Encounter for immunization: Secondary | ICD-10-CM | POA: Diagnosis not present

## 2017-07-21 DIAGNOSIS — Z3A28 28 weeks gestation of pregnancy: Secondary | ICD-10-CM | POA: Diagnosis not present

## 2017-07-21 DIAGNOSIS — O09523 Supervision of elderly multigravida, third trimester: Secondary | ICD-10-CM | POA: Diagnosis not present

## 2017-08-03 DIAGNOSIS — F330 Major depressive disorder, recurrent, mild: Secondary | ICD-10-CM | POA: Diagnosis not present

## 2017-08-03 DIAGNOSIS — Z3A30 30 weeks gestation of pregnancy: Secondary | ICD-10-CM | POA: Diagnosis not present

## 2017-08-03 DIAGNOSIS — O09523 Supervision of elderly multigravida, third trimester: Secondary | ICD-10-CM | POA: Diagnosis not present

## 2017-08-04 DIAGNOSIS — F329 Major depressive disorder, single episode, unspecified: Secondary | ICD-10-CM | POA: Diagnosis not present

## 2017-08-04 DIAGNOSIS — F419 Anxiety disorder, unspecified: Secondary | ICD-10-CM | POA: Diagnosis not present

## 2017-08-04 DIAGNOSIS — O99343 Other mental disorders complicating pregnancy, third trimester: Secondary | ICD-10-CM | POA: Diagnosis not present

## 2017-08-11 DIAGNOSIS — F330 Major depressive disorder, recurrent, mild: Secondary | ICD-10-CM | POA: Diagnosis not present

## 2017-08-17 DIAGNOSIS — Z3A32 32 weeks gestation of pregnancy: Secondary | ICD-10-CM | POA: Diagnosis not present

## 2017-08-17 DIAGNOSIS — O09523 Supervision of elderly multigravida, third trimester: Secondary | ICD-10-CM | POA: Diagnosis not present

## 2017-08-17 DIAGNOSIS — F330 Major depressive disorder, recurrent, mild: Secondary | ICD-10-CM | POA: Diagnosis not present

## 2017-09-01 DIAGNOSIS — Z3A34 34 weeks gestation of pregnancy: Secondary | ICD-10-CM | POA: Diagnosis not present

## 2017-09-01 DIAGNOSIS — O09523 Supervision of elderly multigravida, third trimester: Secondary | ICD-10-CM | POA: Diagnosis not present

## 2017-09-07 DIAGNOSIS — F330 Major depressive disorder, recurrent, mild: Secondary | ICD-10-CM | POA: Diagnosis not present

## 2017-09-18 DIAGNOSIS — Z3A36 36 weeks gestation of pregnancy: Secondary | ICD-10-CM | POA: Diagnosis not present

## 2017-09-18 DIAGNOSIS — Z3685 Encounter for antenatal screening for Streptococcus B: Secondary | ICD-10-CM | POA: Diagnosis not present

## 2017-09-18 DIAGNOSIS — O09523 Supervision of elderly multigravida, third trimester: Secondary | ICD-10-CM | POA: Diagnosis not present

## 2017-09-18 LAB — OB RESULTS CONSOLE GBS: STREP GROUP B AG: NEGATIVE

## 2017-09-20 DIAGNOSIS — F330 Major depressive disorder, recurrent, mild: Secondary | ICD-10-CM | POA: Diagnosis not present

## 2017-09-25 DIAGNOSIS — Z3A37 37 weeks gestation of pregnancy: Secondary | ICD-10-CM | POA: Diagnosis not present

## 2017-09-25 DIAGNOSIS — O09523 Supervision of elderly multigravida, third trimester: Secondary | ICD-10-CM | POA: Diagnosis not present

## 2017-10-04 DIAGNOSIS — F330 Major depressive disorder, recurrent, mild: Secondary | ICD-10-CM | POA: Diagnosis not present

## 2017-10-05 DIAGNOSIS — Z3483 Encounter for supervision of other normal pregnancy, third trimester: Secondary | ICD-10-CM | POA: Diagnosis not present

## 2017-10-10 ENCOUNTER — Inpatient Hospital Stay (HOSPITAL_COMMUNITY): Payer: BLUE CROSS/BLUE SHIELD | Admitting: Anesthesiology

## 2017-10-10 ENCOUNTER — Other Ambulatory Visit: Payer: Self-pay

## 2017-10-10 ENCOUNTER — Encounter (HOSPITAL_COMMUNITY): Payer: Self-pay

## 2017-10-10 ENCOUNTER — Encounter (HOSPITAL_COMMUNITY)
Admission: AD | Disposition: A | Discharge status: Home / Self Care | Payer: Self-pay | Source: Home / Self Care | Attending: Obstetrics and Gynecology

## 2017-10-10 ENCOUNTER — Inpatient Hospital Stay (HOSPITAL_COMMUNITY)
Admission: AD | Admit: 2017-10-10 | Discharge: 2017-10-13 | DRG: 787 | Disposition: A | Discharge status: Home / Self Care | Payer: BLUE CROSS/BLUE SHIELD | Attending: Obstetrics and Gynecology | Admitting: Obstetrics and Gynecology

## 2017-10-10 DIAGNOSIS — O411230 Chorioamnionitis, third trimester, not applicable or unspecified: Secondary | ICD-10-CM | POA: Diagnosis not present

## 2017-10-10 DIAGNOSIS — O9912 Other diseases of the blood and blood-forming organs and certain disorders involving the immune mechanism complicating childbirth: Secondary | ICD-10-CM | POA: Diagnosis not present

## 2017-10-10 DIAGNOSIS — F419 Anxiety disorder, unspecified: Secondary | ICD-10-CM | POA: Diagnosis present

## 2017-10-10 DIAGNOSIS — O99334 Smoking (tobacco) complicating childbirth: Secondary | ICD-10-CM | POA: Diagnosis not present

## 2017-10-10 DIAGNOSIS — R310 Gross hematuria: Secondary | ICD-10-CM | POA: Diagnosis present

## 2017-10-10 DIAGNOSIS — D6959 Other secondary thrombocytopenia: Secondary | ICD-10-CM | POA: Diagnosis present

## 2017-10-10 DIAGNOSIS — O9081 Anemia of the puerperium: Secondary | ICD-10-CM | POA: Diagnosis not present

## 2017-10-10 DIAGNOSIS — D62 Acute posthemorrhagic anemia: Secondary | ICD-10-CM | POA: Diagnosis not present

## 2017-10-10 DIAGNOSIS — R319 Hematuria, unspecified: Secondary | ICD-10-CM | POA: Diagnosis not present

## 2017-10-10 DIAGNOSIS — O34211 Maternal care for low transverse scar from previous cesarean delivery: Secondary | ICD-10-CM | POA: Diagnosis present

## 2017-10-10 DIAGNOSIS — Z3483 Encounter for supervision of other normal pregnancy, third trimester: Secondary | ICD-10-CM | POA: Diagnosis present

## 2017-10-10 DIAGNOSIS — F329 Major depressive disorder, single episode, unspecified: Secondary | ICD-10-CM | POA: Diagnosis not present

## 2017-10-10 DIAGNOSIS — Z3A40 40 weeks gestation of pregnancy: Secondary | ICD-10-CM

## 2017-10-10 DIAGNOSIS — O99344 Other mental disorders complicating childbirth: Secondary | ICD-10-CM | POA: Diagnosis not present

## 2017-10-10 DIAGNOSIS — F17290 Nicotine dependence, other tobacco product, uncomplicated: Secondary | ICD-10-CM | POA: Diagnosis not present

## 2017-10-10 DIAGNOSIS — Z3A00 Weeks of gestation of pregnancy not specified: Secondary | ICD-10-CM | POA: Diagnosis not present

## 2017-10-10 DIAGNOSIS — R339 Retention of urine, unspecified: Secondary | ICD-10-CM | POA: Diagnosis not present

## 2017-10-10 HISTORY — PX: CYSTOSCOPY: SHX5120

## 2017-10-10 LAB — CBC
HCT: 36.4 % (ref 36.0–46.0)
HEMATOCRIT: 33.8 % — AB (ref 36.0–46.0)
Hemoglobin: 12.6 g/dL (ref 12.0–15.0)
Hemoglobin: 11.5 g/dL — ABNORMAL LOW (ref 12.0–15.0)
MCH: 30.1 pg (ref 26.0–34.0)
MCH: 29.7 pg (ref 26.0–34.0)
MCHC: 34.6 g/dL (ref 30.0–36.0)
MCHC: 34 g/dL (ref 30.0–36.0)
MCV: 87.1 fL (ref 78.0–100.0)
MCV: 87.3 fL (ref 78.0–100.0)
PLATELETS: 127 10*3/uL — AB (ref 150–400)
Platelets: 144 10*3/uL — ABNORMAL LOW (ref 150–400)
RBC: 4.18 MIL/uL (ref 3.87–5.11)
RBC: 3.87 MIL/uL (ref 3.87–5.11)
RDW: 13.4 % (ref 11.5–15.5)
RDW: 13.5 % (ref 11.5–15.5)
WBC: 10.2 10*3/uL (ref 4.0–10.5)
WBC: 22 10*3/uL — ABNORMAL HIGH (ref 4.0–10.5)

## 2017-10-10 LAB — TYPE AND SCREEN
ABO/RH(D): A POS
Antibody Screen: NEGATIVE

## 2017-10-10 SURGERY — Surgical Case
Anesthesia: Epidural | Site: Bladder | Wound class: Clean Contaminated

## 2017-10-10 MED ORDER — NALBUPHINE HCL 10 MG/ML IJ SOLN: 5.0000 mg | INTRAMUSCULAR | Status: DC | PRN

## 2017-10-10 MED ORDER — DIPHENHYDRAMINE HCL 25 MG PO CAPS: 25.0000 mg | ORAL_CAPSULE | ORAL | Status: DC | PRN

## 2017-10-10 MED ORDER — LACTATED RINGERS IV SOLN
INTRAVENOUS | Status: DC | PRN
  Administered 2017-10-10: 40 [IU] via INTRAVENOUS

## 2017-10-10 MED ORDER — FENTANYL CITRATE (PF) 100 MCG/2ML IJ SOLN: 25.0000 ug | INTRAMUSCULAR | Status: DC | PRN

## 2017-10-10 MED ORDER — SOD CITRATE-CITRIC ACID 500-334 MG/5ML PO SOLN
ORAL | Status: AC
  Administered 2017-10-10: 14:00:00
  Filled 2017-10-10: qty 15

## 2017-10-10 MED ORDER — NALBUPHINE HCL 10 MG/ML IJ SOLN: 5.0000 mg | Freq: Once | INTRAMUSCULAR | Status: DC | PRN

## 2017-10-10 MED ORDER — OXYCODONE-ACETAMINOPHEN 5-325 MG PO TABS: 1.0000 | ORAL_TABLET | ORAL | Status: DC | PRN

## 2017-10-10 MED ORDER — PROPOFOL 10 MG/ML IV BOLUS
INTRAVENOUS | Status: AC
  Filled 2017-10-10: qty 20

## 2017-10-10 MED ORDER — PHENYLEPHRINE 40 MCG/ML (10ML) SYRINGE FOR IV PUSH (FOR BLOOD PRESSURE SUPPORT)
PREFILLED_SYRINGE | INTRAVENOUS | Status: AC
  Filled 2017-10-10: qty 10

## 2017-10-10 MED ORDER — BUPROPION HCL ER (XL) 300 MG PO TB24
300.0000 mg | ORAL_TABLET | Freq: Every day | ORAL | Status: DC
  Administered 2017-10-11 – 2017-10-13 (×3): 300 mg via ORAL
  Filled 2017-10-10 (×3): qty 1

## 2017-10-10 MED ORDER — OXYTOCIN 10 UNIT/ML IJ SOLN
INTRAMUSCULAR | Status: AC
  Filled 2017-10-10: qty 4

## 2017-10-10 MED ORDER — WITCH HAZEL-GLYCERIN EX PADS: 1.0000 "application " | MEDICATED_PAD | CUTANEOUS | Status: DC | PRN

## 2017-10-10 MED ORDER — MENTHOL 3 MG MT LOZG: 1.0000 | LOZENGE | OROMUCOSAL | Status: DC | PRN

## 2017-10-10 MED ORDER — LIDOCAINE-EPINEPHRINE (PF) 2 %-1:200000 IJ SOLN
INTRAMUSCULAR | Status: AC
  Filled 2017-10-10: qty 20

## 2017-10-10 MED ORDER — IBUPROFEN 600 MG PO TABS
600.0000 mg | ORAL_TABLET | Freq: Four times a day (QID) | ORAL | Status: DC
  Administered 2017-10-11 – 2017-10-13 (×9): 600 mg via ORAL
  Filled 2017-10-10 (×9): qty 1

## 2017-10-10 MED ORDER — SENNOSIDES-DOCUSATE SODIUM 8.6-50 MG PO TABS
2.0000 | ORAL_TABLET | ORAL | Status: DC
  Administered 2017-10-10 – 2017-10-13 (×3): 2 via ORAL
  Filled 2017-10-10 (×2): qty 2

## 2017-10-10 MED ORDER — DIPHENHYDRAMINE HCL 50 MG/ML IJ SOLN: 12.5000 mg | INTRAMUSCULAR | Status: DC | PRN

## 2017-10-10 MED ORDER — STERILE WATER FOR IRRIGATION IR SOLN
Status: DC | PRN
  Administered 2017-10-10: 2000 mL

## 2017-10-10 MED ORDER — ESCITALOPRAM OXALATE 20 MG PO TABS
20.0000 mg | ORAL_TABLET | Freq: Every day | ORAL | Status: DC
  Administered 2017-10-11 – 2017-10-13 (×3): 20 mg via ORAL
  Filled 2017-10-10 (×3): qty 1

## 2017-10-10 MED ORDER — DIPHENHYDRAMINE HCL 25 MG PO CAPS
25.0000 mg | ORAL_CAPSULE | Freq: Four times a day (QID) | ORAL | Status: DC | PRN
  Administered 2017-10-10: 25 mg via ORAL
  Filled 2017-10-10: qty 1

## 2017-10-10 MED ORDER — FENTANYL 2.5 MCG/ML BUPIVACAINE 1/10 % EPIDURAL INFUSION (WH - ANES)
14.0000 mL/h | INTRAMUSCULAR | Status: DC | PRN
  Administered 2017-10-10: 14 mL/h via EPIDURAL
  Filled 2017-10-10: qty 100

## 2017-10-10 MED ORDER — ZOLPIDEM TARTRATE 5 MG PO TABS: 5.0000 mg | ORAL_TABLET | Freq: Every evening | ORAL | Status: DC | PRN

## 2017-10-10 MED ORDER — ONDANSETRON HCL 4 MG/2ML IJ SOLN
4.0000 mg | Freq: Three times a day (TID) | INTRAMUSCULAR | Status: DC | PRN
Start: 2017-10-10 — End: 2017-10-13

## 2017-10-10 MED ORDER — SCOPOLAMINE 1 MG/3DAYS TD PT72
1.0000 | MEDICATED_PATCH | Freq: Once | TRANSDERMAL | Status: DC
  Filled 2017-10-10: qty 1

## 2017-10-10 MED ORDER — PHENYLEPHRINE 40 MCG/ML (10ML) SYRINGE FOR IV PUSH (FOR BLOOD PRESSURE SUPPORT)
PREFILLED_SYRINGE | INTRAVENOUS | Status: AC
  Filled 2017-10-10: qty 20

## 2017-10-10 MED ORDER — LIDOCAINE HCL (CARDIAC) PF 100 MG/5ML IV SOSY
PREFILLED_SYRINGE | INTRAVENOUS | Status: AC
  Filled 2017-10-10: qty 5

## 2017-10-10 MED ORDER — SUCCINYLCHOLINE CHLORIDE 200 MG/10ML IV SOSY
PREFILLED_SYRINGE | INTRAVENOUS | Status: DC | PRN
  Administered 2017-10-10: 140 mg via INTRAVENOUS

## 2017-10-10 MED ORDER — KETOROLAC TROMETHAMINE 30 MG/ML IJ SOLN
30.0000 mg | Freq: Four times a day (QID) | INTRAMUSCULAR | Status: AC | PRN
  Administered 2017-10-10: 30 mg via INTRAVENOUS
  Filled 2017-10-10: qty 1

## 2017-10-10 MED ORDER — SCOPOLAMINE 1 MG/3DAYS TD PT72
MEDICATED_PATCH | TRANSDERMAL | Status: DC | PRN
  Administered 2017-10-10: 1 via TRANSDERMAL

## 2017-10-10 MED ORDER — STERILE WATER FOR IRRIGATION IR SOLN
Status: DC | PRN
  Administered 2017-10-10: 5 mL via INTRAVESICAL

## 2017-10-10 MED ORDER — MEPERIDINE HCL 25 MG/ML IJ SOLN
INTRAMUSCULAR | Status: AC
  Filled 2017-10-10: qty 1

## 2017-10-10 MED ORDER — SODIUM BICARBONATE 8.4 % IV SOLN
INTRAVENOUS | Status: DC | PRN
  Administered 2017-10-10 (×3): 5 mL via EPIDURAL

## 2017-10-10 MED ORDER — METHYLERGONOVINE MALEATE 0.2 MG/ML IJ SOLN
INTRAMUSCULAR | Status: AC
  Filled 2017-10-10: qty 1

## 2017-10-10 MED ORDER — OXYCODONE-ACETAMINOPHEN 5-325 MG PO TABS
2.0000 | ORAL_TABLET | ORAL | Status: DC | PRN
  Administered 2017-10-11 – 2017-10-12 (×2): 2 via ORAL
  Filled 2017-10-10 (×5): qty 2

## 2017-10-10 MED ORDER — METHYLERGONOVINE MALEATE 0.2 MG/ML IJ SOLN
INTRAMUSCULAR | Status: DC | PRN
  Administered 2017-10-10: 0.2 mg via INTRAMUSCULAR

## 2017-10-10 MED ORDER — OXYTOCIN BOLUS FROM INFUSION: 500.0000 mL | Freq: Once | INTRAVENOUS | Status: DC

## 2017-10-10 MED ORDER — SODIUM BICARBONATE 8.4 % IV SOLN
INTRAVENOUS | Status: AC
  Filled 2017-10-10: qty 50

## 2017-10-10 MED ORDER — LACTATED RINGERS IV SOLN
500.0000 mL | Freq: Once | INTRAVENOUS | Status: AC
  Administered 2017-10-10: 500 mL via INTRAVENOUS

## 2017-10-10 MED ORDER — DEXAMETHASONE SODIUM PHOSPHATE 10 MG/ML IJ SOLN
INTRAMUSCULAR | Status: DC | PRN
  Administered 2017-10-10: 10 mg via INTRAVENOUS

## 2017-10-10 MED ORDER — BUPIVACAINE HCL (PF) 0.25 % IJ SOLN
INTRAMUSCULAR | Status: DC | PRN
  Administered 2017-10-10: 30 mL

## 2017-10-10 MED ORDER — EPHEDRINE 5 MG/ML INJ: 10.0000 mg | INTRAVENOUS | Status: DC | PRN

## 2017-10-10 MED ORDER — OXYTOCIN 40 UNITS IN LACTATED RINGERS INFUSION - SIMPLE MED: 2.5000 [IU]/h | INTRAVENOUS | Status: AC

## 2017-10-10 MED ORDER — SUCCINYLCHOLINE CHLORIDE 200 MG/10ML IV SOSY
PREFILLED_SYRINGE | INTRAVENOUS | Status: AC
  Filled 2017-10-10: qty 10

## 2017-10-10 MED ORDER — MIDAZOLAM HCL 2 MG/2ML IJ SOLN
INTRAMUSCULAR | Status: AC
  Filled 2017-10-10: qty 2

## 2017-10-10 MED ORDER — PHENYLEPHRINE 40 MCG/ML (10ML) SYRINGE FOR IV PUSH (FOR BLOOD PRESSURE SUPPORT)
PREFILLED_SYRINGE | INTRAVENOUS | Status: DC | PRN
  Administered 2017-10-10: 120 ug via INTRAVENOUS
  Administered 2017-10-10: 80 ug via INTRAVENOUS
  Administered 2017-10-10: 40 ug via INTRAVENOUS
  Administered 2017-10-10: 80 ug via INTRAVENOUS
  Administered 2017-10-10: 120 ug via INTRAVENOUS
  Administered 2017-10-10: 80 ug via INTRAVENOUS

## 2017-10-10 MED ORDER — NALOXONE HCL 0.4 MG/ML IJ SOLN: 0.4000 mg | INTRAMUSCULAR | Status: DC | PRN

## 2017-10-10 MED ORDER — PRENATAL MULTIVITAMIN CH
1.0000 | ORAL_TABLET | Freq: Every day | ORAL | Status: DC
  Administered 2017-10-11 – 2017-10-12 (×2): 1 via ORAL
  Filled 2017-10-10 (×2): qty 1

## 2017-10-10 MED ORDER — NALBUPHINE HCL 10 MG/ML IJ SOLN
5.0000 mg | INTRAMUSCULAR | Status: DC | PRN
  Administered 2017-10-11: 5 mg via INTRAVENOUS
  Filled 2017-10-10: qty 1

## 2017-10-10 MED ORDER — LACTATED RINGERS IV SOLN
INTRAVENOUS | Status: DC | PRN
  Administered 2017-10-10 (×2): via INTRAVENOUS

## 2017-10-10 MED ORDER — LIDOCAINE HCL (CARDIAC) PF 100 MG/5ML IV SOSY
PREFILLED_SYRINGE | INTRAVENOUS | Status: DC | PRN
  Administered 2017-10-10: 60 mg via INTRATRACHEAL

## 2017-10-10 MED ORDER — BUPIVACAINE HCL (PF) 0.25 % IJ SOLN
INTRAMUSCULAR | Status: AC
  Filled 2017-10-10: qty 30

## 2017-10-10 MED ORDER — PHENYLEPHRINE 40 MCG/ML (10ML) SYRINGE FOR IV PUSH (FOR BLOOD PRESSURE SUPPORT): 80.0000 ug | PREFILLED_SYRINGE | INTRAVENOUS | Status: DC | PRN

## 2017-10-10 MED ORDER — OXYCODONE-ACETAMINOPHEN 5-325 MG PO TABS: 2.0000 | ORAL_TABLET | ORAL | Status: DC | PRN

## 2017-10-10 MED ORDER — LIDOCAINE HCL (PF) 1 % IJ SOLN
INTRAMUSCULAR | Status: DC | PRN
  Administered 2017-10-10: 4 mL via EPIDURAL
  Administered 2017-10-10: 6 mL via EPIDURAL

## 2017-10-10 MED ORDER — SODIUM CHLORIDE 0.9 % IR SOLN
Status: DC | PRN
  Administered 2017-10-10: 1000 mL

## 2017-10-10 MED ORDER — CEFAZOLIN SODIUM-DEXTROSE 2-4 GM/100ML-% IV SOLN
INTRAVENOUS | Status: AC
  Filled 2017-10-10: qty 100

## 2017-10-10 MED ORDER — OXYTOCIN 40 UNITS IN LACTATED RINGERS INFUSION - SIMPLE MED
2.5000 [IU]/h | INTRAVENOUS | Status: DC
  Filled 2017-10-10: qty 1000

## 2017-10-10 MED ORDER — SIMETHICONE 80 MG PO CHEW
80.0000 mg | CHEWABLE_TABLET | Freq: Three times a day (TID) | ORAL | Status: DC
  Administered 2017-10-11 – 2017-10-13 (×7): 80 mg via ORAL
  Filled 2017-10-10 (×7): qty 1

## 2017-10-10 MED ORDER — LIDOCAINE HCL (PF) 1 % IJ SOLN
30.0000 mL | INTRAMUSCULAR | Status: DC | PRN
  Filled 2017-10-10: qty 30

## 2017-10-10 MED ORDER — OXYCODONE HCL 5 MG/5ML PO SOLN: 5.0000 mg | Freq: Once | ORAL | Status: DC | PRN

## 2017-10-10 MED ORDER — NALOXONE HCL 4 MG/10ML IJ SOLN
1.0000 ug/kg/h | INTRAVENOUS | Status: DC | PRN
  Filled 2017-10-10: qty 5

## 2017-10-10 MED ORDER — PROMETHAZINE HCL 25 MG/ML IJ SOLN: 6.2500 mg | INTRAMUSCULAR | Status: DC | PRN

## 2017-10-10 MED ORDER — ALBUMIN HUMAN 5 % IV SOLN
INTRAVENOUS | Status: DC | PRN
  Administered 2017-10-10: 15:00:00 via INTRAVENOUS

## 2017-10-10 MED ORDER — LACTATED RINGERS IV SOLN
INTRAVENOUS | Status: DC
  Administered 2017-10-10: 22:00:00 via INTRAVENOUS

## 2017-10-10 MED ORDER — MEPERIDINE HCL 25 MG/ML IJ SOLN
6.2500 mg | INTRAMUSCULAR | Status: DC | PRN
Start: 2017-10-10 — End: 2017-10-10
  Administered 2017-10-10: 6.25 mg via INTRAVENOUS

## 2017-10-10 MED ORDER — ALBUMIN HUMAN 5 % IV SOLN
INTRAVENOUS | Status: AC
  Filled 2017-10-10: qty 250

## 2017-10-10 MED ORDER — MORPHINE SULFATE (PF) 0.5 MG/ML IJ SOLN
INTRAMUSCULAR | Status: AC
  Filled 2017-10-10: qty 10

## 2017-10-10 MED ORDER — PROPOFOL 10 MG/ML IV BOLUS
INTRAVENOUS | Status: DC | PRN
  Administered 2017-10-10: 180 ug via INTRAVENOUS

## 2017-10-10 MED ORDER — OXYCODONE-ACETAMINOPHEN 5-325 MG PO TABS
1.0000 | ORAL_TABLET | ORAL | Status: DC | PRN
  Administered 2017-10-11 – 2017-10-13 (×8): 1 via ORAL
  Filled 2017-10-10 (×7): qty 1

## 2017-10-10 MED ORDER — SCOPOLAMINE 1 MG/3DAYS TD PT72
MEDICATED_PATCH | TRANSDERMAL | Status: AC
  Filled 2017-10-10: qty 1

## 2017-10-10 MED ORDER — OXYCODONE HCL 5 MG PO TABS: 5.0000 mg | ORAL_TABLET | Freq: Once | ORAL | Status: DC | PRN

## 2017-10-10 MED ORDER — KETOROLAC TROMETHAMINE 30 MG/ML IJ SOLN: 30.0000 mg | Freq: Four times a day (QID) | INTRAMUSCULAR | Status: AC | PRN

## 2017-10-10 MED ORDER — PHENYLEPHRINE 40 MCG/ML (10ML) SYRINGE FOR IV PUSH (FOR BLOOD PRESSURE SUPPORT)
80.0000 ug | PREFILLED_SYRINGE | INTRAVENOUS | Status: DC | PRN
  Filled 2017-10-10: qty 10

## 2017-10-10 MED ORDER — DIBUCAINE 1 % RE OINT: 1.0000 "application " | TOPICAL_OINTMENT | RECTAL | Status: DC | PRN

## 2017-10-10 MED ORDER — SODIUM CHLORIDE 0.9% FLUSH: 3.0000 mL | INTRAVENOUS | Status: DC | PRN

## 2017-10-10 MED ORDER — ONDANSETRON HCL 4 MG/2ML IJ SOLN
INTRAMUSCULAR | Status: DC | PRN
  Administered 2017-10-10: 4 mg via INTRAVENOUS

## 2017-10-10 MED ORDER — LACTATED RINGERS IV SOLN: 500.0000 mL | INTRAVENOUS | Status: DC | PRN

## 2017-10-10 MED ORDER — SIMETHICONE 80 MG PO CHEW
80.0000 mg | CHEWABLE_TABLET | ORAL | Status: DC
  Administered 2017-10-10 – 2017-10-13 (×3): 80 mg via ORAL
  Filled 2017-10-10: qty 1

## 2017-10-10 MED ORDER — TERBUTALINE SULFATE 1 MG/ML IJ SOLN
INTRAMUSCULAR | Status: AC
  Administered 2017-10-10: 0.25 mg
  Filled 2017-10-10: qty 1

## 2017-10-10 MED ORDER — ACETAMINOPHEN 325 MG PO TABS: 650.0000 mg | ORAL_TABLET | ORAL | Status: DC | PRN

## 2017-10-10 MED ORDER — CEFAZOLIN SODIUM-DEXTROSE 2-3 GM-%(50ML) IV SOLR
INTRAVENOUS | Status: DC | PRN
  Administered 2017-10-10: 2 g via INTRAVENOUS

## 2017-10-10 MED ORDER — COCONUT OIL OIL: 1.0000 "application " | TOPICAL_OIL | Status: DC | PRN

## 2017-10-10 MED ORDER — LACTATED RINGERS IV SOLN
INTRAVENOUS | Status: DC
  Administered 2017-10-10 (×2): via INTRAVENOUS

## 2017-10-10 MED ORDER — MORPHINE SULFATE-NACL 0.5-0.9 MG/ML-% IV SOSY
PREFILLED_SYRINGE | INTRAVENOUS | Status: DC | PRN
  Administered 2017-10-10: 3 mg via EPIDURAL

## 2017-10-10 MED ORDER — SIMETHICONE 80 MG PO CHEW: 80.0000 mg | CHEWABLE_TABLET | ORAL | Status: DC | PRN

## 2017-10-10 MED ORDER — SOD CITRATE-CITRIC ACID 500-334 MG/5ML PO SOLN: 30.0000 mL | ORAL | Status: DC | PRN

## 2017-10-10 SURGICAL SUPPLY — 46 items
BARRIER ADHS 3X4 INTERCEED (GAUZE/BANDAGES/DRESSINGS) ×3 IMPLANT
BENZOIN TINCTURE PRP APPL 2/3 (GAUZE/BANDAGES/DRESSINGS) ×3 IMPLANT
CATH FOLEY 3WAY 30CC 16FR (CATHETERS) ×3 IMPLANT
CHLORAPREP W/TINT 26ML (MISCELLANEOUS) ×3 IMPLANT
CLAMP CORD UMBIL (MISCELLANEOUS) IMPLANT
CLOSURE STERI STRIP 1/2 X4 (GAUZE/BANDAGES/DRESSINGS) ×3 IMPLANT
CLOTH BEACON ORANGE TIMEOUT ST (SAFETY) ×3 IMPLANT
DRAPE C SECTION CLR SCREEN (DRAPES) ×3 IMPLANT
DRAPE SHEET LG 3/4 BI-LAMINATE (DRAPES) ×3 IMPLANT
DRAPE UNDER BUTCK 40X44W/POUCH (DRAPES) ×3 IMPLANT
DRSG OPSITE POSTOP 4X10 (GAUZE/BANDAGES/DRESSINGS) ×3 IMPLANT
ELECT REM PT RETURN 9FT ADLT (ELECTROSURGICAL) ×3
ELECTRODE REM PT RTRN 9FT ADLT (ELECTROSURGICAL) ×2 IMPLANT
EXTRACTOR VACUUM M CUP 4 TUBE (SUCTIONS) IMPLANT
GLOVE BIOGEL PI IND STRL 7.0 (GLOVE) ×4 IMPLANT
GLOVE BIOGEL PI INDICATOR 7.0 (GLOVE) ×2
GLOVE ECLIPSE 6.5 STRL STRAW (GLOVE) ×3 IMPLANT
GOWN STRL REUS W/TWL LRG LVL3 (GOWN DISPOSABLE) ×6 IMPLANT
HEMOSTAT SURGICEL 4X8 (HEMOSTASIS) ×3 IMPLANT
KIT ABG SYR 3ML LUER SLIP (SYRINGE) IMPLANT
NEEDLE HYPO 22GX1.5 SAFETY (NEEDLE) ×3 IMPLANT
NEEDLE HYPO 25X5/8 SAFETYGLIDE (NEEDLE) IMPLANT
NS IRRIG 1000ML POUR BTL (IV SOLUTION) ×3 IMPLANT
PACK C SECTION WH (CUSTOM PROCEDURE TRAY) ×3 IMPLANT
PAD OB MATERNITY 4.3X12.25 (PERSONAL CARE ITEMS) ×3 IMPLANT
RTRCTR C-SECT PINK 25CM LRG (MISCELLANEOUS) IMPLANT
SET CYSTO W/LG BORE CLAMP LF (SET/KITS/TRAYS/PACK) ×3 IMPLANT
STRIP CLOSURE SKIN 1/2X4 (GAUZE/BANDAGES/DRESSINGS) IMPLANT
SUT CHROMIC GUT AB #0 18 (SUTURE) IMPLANT
SUT MNCRL 0 VIOLET CTX 36 (SUTURE) ×8 IMPLANT
SUT MON AB 2-0 SH 27 (SUTURE)
SUT MON AB 2-0 SH27 (SUTURE) IMPLANT
SUT MON AB 3-0 SH 27 (SUTURE)
SUT MON AB 3-0 SH27 (SUTURE) IMPLANT
SUT MON AB 4-0 PS1 27 (SUTURE) IMPLANT
SUT MONOCRYL 0 CTX 36 (SUTURE) ×4
SUT PLAIN 2 0 (SUTURE)
SUT PLAIN 2 0 XLH (SUTURE) IMPLANT
SUT PLAIN ABS 2-0 CT1 27XMFL (SUTURE) IMPLANT
SUT VIC AB 0 CT1 36 (SUTURE) ×6 IMPLANT
SUT VIC AB 2-0 CT1 27 (SUTURE) ×1
SUT VIC AB 2-0 CT1 TAPERPNT 27 (SUTURE) ×2 IMPLANT
SUT VIC AB 4-0 PS2 27 (SUTURE) IMPLANT
SYR CONTROL 10ML LL (SYRINGE) ×3 IMPLANT
TOWEL OR 17X24 6PK STRL BLUE (TOWEL DISPOSABLE) ×3 IMPLANT
TRAY FOLEY W/BAG SLVR 14FR LF (SET/KITS/TRAYS/PACK) IMPLANT

## 2017-10-10 NOTE — Anesthesia Preprocedure Evaluation (Signed)
Anesthesia Evaluation  Patient identified by MRN, date of birth, ID band Patient awake    Reviewed: Allergy & Precautions, NPO status , Patient's Chart, lab work & pertinent test results  Airway Mallampati: II  TM Distance: >3 FB Neck ROM: Full    Dental no notable dental hx.    Pulmonary neg pulmonary ROS,    Pulmonary exam normal breath sounds clear to auscultation       Cardiovascular negative cardio ROS Normal cardiovascular exam Rhythm:Regular Rate:Normal     Neuro/Psych PSYCHIATRIC DISORDERS Anxiety Depression negative neurological ROS     GI/Hepatic negative GI ROS, Neg liver ROS,   Endo/Other  negative endocrine ROS  Renal/GU negative Renal ROS     Musculoskeletal negative musculoskeletal ROS (+)   Abdominal   Peds  Hematology Gestational thrombocytopenia   Anesthesia Other Findings   Reproductive/Obstetrics (+) Pregnancy                             Anesthesia Physical  Anesthesia Plan  ASA: II  Anesthesia Plan: Epidural   Post-op Pain Management:    Induction:   PONV Risk Score and Plan:   Airway Management Planned: Natural Airway  Additional Equipment: None  Intra-op Plan:   Post-operative Plan:   Informed Consent: I have reviewed the patients History and Physical, chart, labs and discussed the procedure including the risks, benefits and alternatives for the proposed anesthesia with the patient or authorized representative who has indicated his/her understanding and acceptance.     Plan Discussed with: Anesthesiologist  Anesthesia Plan Comments: (Labs reviewed. Platelets acceptable, patient not taking any blood thinning medications. Risks and benefits discussed with patient, patient expressed understanding and wished to proceed.)        Anesthesia Quick Evaluation

## 2017-10-10 NOTE — Transfer of Care (Signed)
Immediate Anesthesia Transfer of Care Note  Patient: Linda Church  Procedure(s) Performed: CESAREAN SECTION (N/A ) CYSTOSCOPY ([place])  Patient Location: PACU  Anesthesia Type:General  Level of Consciousness: sedated  Airway & Oxygen Therapy: Patient Spontanous Breathing and Patient connected to nasal cannula oxygen  Post-op Assessment: Report given to RN  Post vital signs: Reviewed and stable  Last Vitals:  Vitals Value Taken Time  BP    Temp    Pulse 98 10/10/2017  4:16 PM  Resp 16 10/10/2017  4:16 PM  SpO2 100 % 10/10/2017  4:16 PM  Vitals shown include unvalidated device data.  Last Pain:  Vitals:   10/10/17 1205  TempSrc: Oral         Complications: No apparent anesthesia complications

## 2017-10-10 NOTE — MAU Note (Signed)
Contractions started at 0800. No bleeding or leaking.

## 2017-10-10 NOTE — Anesthesia Postprocedure Evaluation (Signed)
Anesthesia Post Note  Patient: Linda Church  Procedure(s) Performed: CESAREAN SECTION (N/A Abdomen) CYSTOSCOPY (Bladder)     Patient location during evaluation: PACU Anesthesia Type: Epidural and General Level of consciousness: awake and alert Pain management: pain level controlled Vital Signs Assessment: post-procedure vital signs reviewed and stable Respiratory status: spontaneous breathing, nonlabored ventilation and respiratory function stable Cardiovascular status: blood pressure returned to baseline and stable Postop Assessment: no apparent nausea or vomiting and epidural receding Anesthetic complications: no    Last Vitals:  Vitals:   10/10/17 1940 10/10/17 2045  BP: 115/76 122/87  Pulse: 70 87  Resp:    Temp: 36.5 C 36.7 C  SpO2: 98% 100%    Last Pain:  Vitals:   10/10/17 2045  TempSrc: Oral  PainSc: 3                  Audry Pili

## 2017-10-10 NOTE — Brief Op Note (Signed)
10/10/2017  3:59 PM  PATIENT:  Linda Church  42 y.o. female  PRE-OPERATIVE DIAGNOSIS:  Non reassuring fetal tracing, term gestation, hyperextended vtx presentation  POST-OPERATIVE DIAGNOSIS: same, gross hematuria  PROCEDURE:  Primary cesarean section, kerr hysteroscopy, cystoscopy  SURGEON:  Surgeon(s) and Role:    * Servando Salina, MD - Primary  PHYSICIAN ASSISTANT:   ASSISTANTS: Artelia Laroche CNM   ANESTHESIA:   epidural and general Findings: live female hyperextended direct OP, nl tubes and ovaries, gross hematuria. Cord ph 7.13. mec fluid EBL:  1096 mL   BLOOD ADMINISTERED:none  DRAINS: none   LOCAL MEDICATIONS USED:  NONE  SPECIMEN:  Source of Specimen:  placenta  DISPOSITION OF SPECIMEN:  PATHOLOGY  COUNTS:  YES  TOURNIQUET:  * No tourniquets in log *  DICTATION: .Other Dictation: Dictation Number [id number]  PLAN OF CARE: Admit to inpatient   PATIENT DISPOSITION:  PACU - hemodynamically stable.   Delay start of Pharmacological VTE agent (>24hrs) due to surgical blood loss or risk of bleeding: no

## 2017-10-10 NOTE — Anesthesia Procedure Notes (Addendum)
Procedure Name: Intubation Date/Time: 10/10/2017 2:37 PM Performed by: Audry Pili, MD Pre-anesthesia Checklist: Patient identified, Emergency Drugs available, Suction available and Patient being monitored Patient Re-evaluated:Patient Re-evaluated prior to induction Oxygen Delivery Method: Circle system utilized Preoxygenation: Pre-oxygenation with 100% oxygen Induction Type: IV induction, Rapid sequence and Cricoid Pressure applied Laryngoscope Size: Glidescope and 3 Grade View: Grade I Tube type: Oral Tube size: 7.0 mm Number of attempts: 1 Airway Equipment and Method: Video-laryngoscopy and Rigid stylet Placement Confirmation: ETT inserted through vocal cords under direct vision,  positive ETCO2 and breath sounds checked- equal and bilateral Secured at: 22 cm Tube secured with: Tape Dental Injury: Teeth and Oropharynx as per pre-operative assessment  Comments: Gastric contents noted in posterior oropharynx during intubation, no aspiration witnessed. Stomach aspirated with OG tube following intubation. See quick note for details.

## 2017-10-10 NOTE — Anesthesia Procedure Notes (Signed)
Epidural Patient location during procedure: OB Start time: 10/10/2017 1:15 PM End time: 10/10/2017 1:19 PM  Staffing Anesthesiologist: Audry Pili, MD Performed: anesthesiologist   Preanesthetic Checklist Completed: patient identified, pre-op evaluation, timeout performed, IV checked, risks and benefits discussed and monitors and equipment checked  Epidural Patient position: sitting Prep: DuraPrep Patient monitoring: continuous pulse ox and blood pressure Approach: midline Location: L3-L4 Injection technique: LOR saline  Needle:  Needle type: Tuohy  Needle gauge: 17 G Needle length: 9 cm Needle insertion depth: 4.5 cm Catheter size: 19 Gauge Catheter at skin depth: 10 cm Test dose: negative and Other (1% lidocaine)  Additional Notes Patient identified. Risks including, but not limited to, bleeding, infection, nerve damage, paralysis, inadequate analgesia, blood pressure changes, nausea, vomiting, allergic reaction, postpartum back pain, itching, and headache were discussed. Patient expressed understanding and wished to proceed. Sterile prep and drape, including hand hygiene, mask, and sterile gloves were used. The patient was positioned and the spine was prepped. The skin was anesthetized with lidocaine. No paraesthesia or other complication noted. The patient did not experience any signs of intravascular injection such as tinnitus or metallic taste in mouth, nor signs of intrathecal spread such as rapid motor block. Please see nursing notes for vital signs. The patient tolerated the procedure well.   Renold Don, MDReason for block:procedure for pain

## 2017-10-10 NOTE — Op Note (Signed)
NAMEKEMARI, MARES MEDICAL RECORD IO:[id number] ACCOUNT [id number] DATE OF BIRTH:10-17-1975 FACILITY: [facility] LOCATION: [id number] PHYSICIAN:Christelle Igoe A. Brizza Nathanson, MD  OPERATIVE REPORT  DATE OF PROCEDURE:  10/10/2017  PREOPERATIVE DIAGNOSES:  Nonreassuring fetal tracing, hyperextended vertex presentation, term gestation.  PROCEDURE:  Emergent primary cesarean section, Kerr hysterotomy, cystoscopy.  POSTOPERATIVE DIAGNOSES:  Nonreassuring fetal tracing hyperextended vertex presentation, term gestation, gross hematuria.  ANESTHESIA:  Epidural, general anesthesia.  SURGEON:  Servando Salina, MD  ASSISTANT:  Artelia Laroche, CNM   DESCRIPTION OF PROCEDURE:  The patient was quickly transferred to the operating room.  An indwelling Foley catheter was already in place.  The patient was medicated for epidural for surgical procedure.  She was quickly prepped, draped, and a Pfannenstiel skin incision was quickly made, carried down to the rectus fascia.  The rectus fascia was opened quickly, bluntly, and sharply dissected superiorly and inferiorly.  Rectus muscle split in the midline.  The parietal peritoneum was entered bluntly.  A  bladder retractor was then placed.  Vesicouterine peritoneum was opened transversely.  The bladder was displaced inferiorly, and a curvilinear incision was made at that point.  Notification from anesthesia that there was general anesthesia about to be induced.  Meconium fluid was noted, and on initial entry into the uterine cavity, the baby was found in the occiput posterior presentation.  Initial attempt at bringing the head, which was wedged in the pelvis, was unsuccessful, and assistance from the vaginal side to displace the head superiorly was requested and accomplished.  The baby was quickly bulb suctioned, cord was clamped and cut.  The baby was transferred to the waiting neonatal intensive care team.  Apgars of 5 and 7 were subsequently assigned at  one and five  minutes.  The placenta was manually removed from its anterior position and sent to pathology.  Cord pH was obtained which ultimately was 7.13.  Cord blood was obtained as well.  The uterine cavity was cleaned of debris.  The uterine incision had no extension.  An Alexis retractor was then placed.  The incision was closed with 0 Monocryl running locked stitch first layer, second layer imbricated using 0 Monocryl suture.  Bleeding on the left angle resulted in O'Leary stitch x2 being placed  in order to achieve hemostasis.  On the right mediolateral aspect of the incision  was also bleeding that resulted in several figure-of-eight sutures being placed.  Normal tubes and ovaries were noted.  The pelvis was inspected.  When hemostasis was achieved, initial question in the operating room of the nurse anesthetist regarding the urine had revealed that there was a little bit of blood. Surgicel was placed over the incision followed by Interceed, and then the Alexis retractor was removed.   Then, subsequently, the parietal peritoneum was then closed with 2-0 Vicryl.  The rectus fascia was closed with 0 Vicryl x2.  The patient had very minimal subcutaneous area, and the skin was approximated with 4-0 Vicryl subcuticular closure.   Steri-Strips and benzoin was placed.  On going to review the Foley bag, it was then noticed that there was gross hematuria.  Further questioning in the operating room from the nurses from the labor and delivery suite reports that the urine had been yellow coming into the room.  At that point, a decision was then made to place the patient in the dorsal lithotomy position, betadine prep of the vulva and urethra was done and proceed with a 70-degree cystoscope introduced into the bladder.  Indigo carmine was given  intravenously 7 minutes prior. After identifying both ureteral jets which showed blue briskly coming through both, the bladder initially had some bloody fluid that subsequently cleared,  the rest of the bladder inspection was unremarkable.  At that point, the cystoscope was removed, and a 3-way Foley catheter was sterilely inserted.  The patient was taken out of that dorsal lithotomy position and subsequently transferred to the recovery room.  SPECIMEN:  Placenta sent to pathology.  ESTIMATED BLOOD LOSS:  1196 mL.   URINE:  250 mL bloody urine.  INTRAOPERATIVE FLUIDS:  2 L of Ringer's lactate and 250 of albumin.    COMPLICATIONS:  None.  COUNTS:  Sponge and instrument count x2 was correct.  The patient was transferred to the recovery room in stable condition. Baby was transferred to NICU.  LN/NUANCE  D:10/10/2017 T:10/10/2017 JOB:[id number]

## 2017-10-10 NOTE — Progress Notes (Signed)
Late entry  Called at 2.13pm by unit secretary regarding possible need for a C/S in room 164. On arrival, pt was found on all fours and pushing with Tanya CNM./ Maternal O2 in place Report fetal heart rate decelerations but thought it maybe able to be vacuumed the patient was instructed to lie on back. Pedi called for possible vacuum and OR was already opened on inquiry.  Pt placed in stirrup. VE  vtx not in range for low vacuum. Had pt pushed and min descent to +1 with further exam revealing the ant fontanelle centered c/w hyper extended head. Decision at that pt was made to proceed with C/S. Verbal order for Fithian terbutaline given. Pt notified of need for C/S and verbally agreed. To OR

## 2017-10-10 NOTE — Anesthesia Pain Management Evaluation Note (Signed)
  CRNA Pain Management Visit Note  Patient: Linda Church, 42 y.o., female  "Hello I am a member of the anesthesia team at [facility]. We have an anesthesia team available at all times to provide care throughout the hospital, including epidural management and anesthesia for C-section. I don't know your plan for the delivery whether it a natural birth, water birth, IV sedation, nitrous supplementation, doula or epidural, but we want to meet your pain goals."   1.Was your pain managed to your expectations on prior hospitalizations?   Yes   2.What is your expectation for pain management during this hospitalization?     Doula  3.How can we help you reach that goal? unsure  Record the patient's initial score and the patient's pain goal.   Pain: 7  Pain Goal: 8 [facility] wants you to be able to say your pain was always managed very well.  Casimer Lanius 10/10/2017

## 2017-10-11 DIAGNOSIS — O9081 Anemia of the puerperium: Secondary | ICD-10-CM | POA: Diagnosis not present

## 2017-10-11 LAB — CBC
HEMATOCRIT: 25.8 % — AB (ref 36.0–46.0)
Hemoglobin: 9.1 g/dL — ABNORMAL LOW (ref 12.0–15.0)
MCH: 31 pg (ref 26.0–34.0)
MCHC: 35.3 g/dL (ref 30.0–36.0)
MCV: 87.8 fL (ref 78.0–100.0)
Platelets: 115 10*3/uL — ABNORMAL LOW (ref 150–400)
RBC: 2.94 MIL/uL — ABNORMAL LOW (ref 3.87–5.11)
RDW: 13.5 % (ref 11.5–15.5)
WBC: 13.1 10*3/uL — ABNORMAL HIGH (ref 4.0–10.5)

## 2017-10-11 LAB — RPR: RPR Ser Ql: NONREACTIVE

## 2017-10-11 MED ORDER — MAGNESIUM OXIDE 400 (241.3 MG) MG PO TABS
400.0000 mg | ORAL_TABLET | Freq: Every day | ORAL | Status: DC
  Administered 2017-10-11 – 2017-10-13 (×3): 400 mg via ORAL
  Filled 2017-10-11 (×3): qty 1

## 2017-10-11 MED ORDER — POLYSACCHARIDE IRON COMPLEX 150 MG PO CAPS
150.0000 mg | ORAL_CAPSULE | Freq: Every day | ORAL | Status: DC
  Administered 2017-10-11 – 2017-10-13 (×3): 150 mg via ORAL
  Filled 2017-10-11 (×3): qty 1

## 2017-10-11 NOTE — Addendum Note (Signed)
Addendum  created 10/11/17 0936 by Ignacia Bayley, CRNA   Sign clinical note

## 2017-10-11 NOTE — Progress Notes (Signed)
Patient ID: Linda Church, female   DOB: Mar 25, 1976, 42 y.o.   MRN: [id number]  Post Partum Day # 1; s/p primary cesarean section for non-reassuring fetal heart tracing with hyperextended vertex presentation, kerr hsteroscopy, cystoscopy  Subjective:  Doing well overall. Pumping without difficulty. Unable to void at this time. Infant in NICU.   Denies difficulty breathing or respiratory distress, chest pain, and leg pain or swelling.   Objective:  Temp:  [97.6 F (36.4 C)-98.8 F (37.1 C)] 97.8 F (36.6 C) (07/03 0820) Pulse Rate:  [60-102] 68 (07/03 0820) Resp:  [8-23] 18 (07/03 0820) BP: (99-149)/(63-94) 100/67 (07/03 0820) SpO2:  [98 %-100 %] 100 % (07/03 0820) Weight:  [66.3 kg (146 lb 1.9 oz)-66.6 kg (146 lb 12 oz)] 66.3 kg (146 lb 1.9 oz) (07/02 1212)  Physical Exam:   General: alert and cooperative   Lungs: clear to auscultation bilaterally  Breasts: deferred, no complaints  Heart: normal apical impulse  Abdomen: soft, non-tender; bowel sounds normal; no masses,  no organomegaly; incision covered by dressing  Pelvis: Lochia: appropriate,  Uterine Fundus: firm  Extremities: DVT Evaluation: No evidence of DVT seen on physical exam.  Recent Labs    10/10/17 1637 10/11/17 0529  HGB 11.5* 9.1*  HCT 33.8* 25.8*    Assessment:  42 year old G2P2  s/p primary cesarean section for non-reassuring fetal heart tracing with hyperextended vertex presentation, kerr hsteroscopy, cystoscopy, Rh positive, blood loss anemia   Plan:  Bladder scanner revealed approximately 1000 ml of urine. Will in and out cath.   Encouraged ambulation and bladder training. Plans to visit infant in NICU soon.   Iron supplementation, see orders.   Reviewed red flag symptoms and when to call.   Continue orders as written. Reassess as needed.   LOS: 1 day    Diona Fanti, CNM [facility] 10/11/2017 11:13 AM

## 2017-10-11 NOTE — Lactation Note (Signed)
This note was copied from a baby's chart. Lactation Consultation Note  Patient Name: Linda Church [id number] Date: 10/11/2017 Reason for consult: Initial assessment;Term   Maternal Data Formula Feeding for Exclusion: No Has patient been taught Hand Expression?: Yes  P2 mother whose infant is now 73 hours old and in the NICU.  Mother is hoping to be able to latch baby today when she visits.  Mother has no questions/concerns regarding breastfeeding.  Her breasts are soft and non tender and no nipple breakdown.  Mother has been set up with a DEBP and has started getting a few mls of colostrum when she pumps.  She knows to save any EBM she obtains to take to the NICU.  Suggested she do hand expression after her pumping.  I encouraged her to pump every 3 hours and to pump at baby's bedside in the NICU to help increase milk volume.  Reminded mother to bring all pump parts with her and that she can also pump in a NICU room if she prefers.  Mom made aware of O/P services, breastfeeding support groups, community resources, and our phone # for post-discharge questions. Mother will call for assistance as needed.              Consult Status Consult Status: Follow-up Date: 10/12/17 Follow-up type: In-patient    Antonyo Hinderer R Lenwood Balsam 10/11/2017, 6:08 AM

## 2017-10-11 NOTE — Anesthesia Postprocedure Evaluation (Signed)
Anesthesia Post Note  Patient: Linda Church  Procedure(s) Performed: CESAREAN SECTION (N/A Abdomen) CYSTOSCOPY (Bladder)     Patient location during evaluation: [facility] Anesthesia Type: Epidural Level of consciousness: awake Pain management: pain level controlled Vital Signs Assessment: post-procedure vital signs reviewed and stable Respiratory status: spontaneous breathing Cardiovascular status: stable Postop Assessment: no headache, no backache, epidural receding, patient able to bend at knees, adequate PO intake, able to ambulate and no apparent nausea or vomiting Anesthetic complications: no    Last Vitals:  Vitals:   10/11/17 0625 10/11/17 0820  BP: 104/63 100/67  Pulse: 66 68  Resp: 16 18  Temp: 36.8 C 36.6 C  SpO2: 100% 100%    Last Pain:  Vitals:   10/11/17 0800  TempSrc:   PainSc: 5    Pain Goal: Patients Stated Pain Goal: 3 (10/11/17 0800)               Barkley Boards

## 2017-10-12 DIAGNOSIS — F419 Anxiety disorder, unspecified: Secondary | ICD-10-CM | POA: Diagnosis present

## 2017-10-12 NOTE — Plan of Care (Signed)
  Problem: Elimination: Goal: Will not experience complications related to bowel motility Outcome: Progressing  Pt urine output WDL with urinary catheter.  Awaiting MD orders for removal.

## 2017-10-12 NOTE — Progress Notes (Addendum)
Subjective: POD# 2 Information for the patient's newborn:  Linda Church, Linda Church [[id number]]  female  Reports feeling well, ambulated to NICU Feeding: breast Patient reports tolerating PO.  Breast symptoms: + colostrum, pumping Pain controlled with PO meds Denies HA/SOB/C/P/N/V/dizziness. Flatus present. She reports vaginal bleeding as normal, without clots.  She is ambulating, Foley cath in place since replacement yesterday d/t urinary retention.    Objective:   VS:    Vitals:   10/11/17 0625 10/11/17 0820 10/11/17 2308 10/12/17 0526  BP: 104/63 100/67 (!) 96/50 (!) 87/52  Pulse: 66 68 74 70  Resp: 16 18  18   Temp: 98.2 F (36.8 C) 97.8 F (36.6 C) 98 F (36.7 C) 97.7 F (36.5 C)  TempSrc: Axillary  Oral Oral  SpO2: 100% 100%  100%  Weight:      Height:          Intake/Output Summary (Last 24 hours) at 10/12/2017 1202 Last data filed at 10/12/2017 1130 Gross per 24 hour  Intake -  Output 4250 ml  Net -4250 ml        Recent Labs    10/10/17 1637 10/11/17 0529  WBC 22.0* 13.1*  HGB 11.5* 9.1*  HCT 33.8* 25.8*  PLT 127* 115*     Blood type: --/--/A POS (07/02 1146)  Rubella: Immune (12/06 0000)     Physical Exam:  General: alert, cooperative and no distress Abdomen: soft, nontender, normal bowel sounds Incision: clean, dry, intact and pressure dressing removed, honeycomb applied Uterine Fundus: firm, below umbilicus, nontender Lochia: minimal Ext: no edema, redness or tenderness in the calves or thighs      Assessment/Plan: 42 y.o.   POD# 2. [id number]                  Active Problems:   1C/S - Indication: emergent/FITL 7/2   Postpartum care following cesarean delivery (7/2)   Postpartum anemia   Anxiety disorder   Doing well, stable.  Started oral Fe and Mag Ox   Will DC foley and monitor for urinary retention, encouraged to void q 1-2 hrs while awake Anxiety symptoms well controlled with dual regimen, to continue Anticipate DC home tomorrow,  baby to come out of NICU at same time Routine post-op care  Juliene Pina, CNM, MSN 10/12/2017, 12:02 PM

## 2017-10-12 NOTE — Progress Notes (Signed)
MOB was referred for history of depression/anxiety. * Referral screened out by Clinical Social Worker because none of the following criteria appear to apply: ~ History of anxiety/depression during this pregnancy, or of post-partum depression. ~ Diagnosis of anxiety and/or depression within last 3 years OR * MOB's symptoms currently being treated with medication and/or therapy. Please contact the Clinical Social Worker if needs arise, by [facility] request, or if MOB scores greater than 9/yes to question 10 on Edinburgh Postpartum Depression Screen.  MOB is being treated with medication and counseling.  Baby appears to be doing well and progressing quickly in NICU.

## 2017-10-12 NOTE — Progress Notes (Signed)
Foley catheter removed at 1130 per verbal order from [place].  Hat placed in toilet to measure the pt's voids. Pt encouraged to increase fluid intake and attempt to void every few hours.

## 2017-10-13 MED ORDER — OXYCODONE-ACETAMINOPHEN 5-325 MG PO TABS: 2.0000 | ORAL_TABLET | ORAL | 0 refills | Status: DC | PRN

## 2017-10-13 MED ORDER — MAGNESIUM OXIDE 400 (241.3 MG) MG PO TABS: 400.0000 mg | ORAL_TABLET | Freq: Every day | ORAL | 1 refills | Status: AC

## 2017-10-13 MED ORDER — POLYSACCHARIDE IRON COMPLEX 150 MG PO CAPS: 150.0000 mg | ORAL_CAPSULE | Freq: Every day | ORAL | 1 refills | Status: DC

## 2017-10-13 NOTE — Lactation Note (Addendum)
This note was copied from a baby's chart. Lactation Consultation Note  Patient Name: [name] Today's Date: 10/13/2017    P2, Baby 66 hours.  Ex BF.  Mother denies questions or concerns and states she knows about milk storage. Mother has been pumping approx 30 cc of breastmilk and was able latch baby yesterday. Denies needing OP appt which was offered. Mother has personal DEBP at home. Discussed taking her Medela parts home. Reviewed engorgement care and monitoring voids/stools.      Maternal Data    Feeding Feeding Type: Formula Nipple Type: Slow - flow Length of feed: 20 min  LATCH Score                   Interventions    Lactation Tools Discussed/Used     Consult Status      Carlye Grippe 10/13/2017, 9:04 AM

## 2017-10-13 NOTE — Discharge Summary (Signed)
Obstetric Discharge Summary  Patient ID: Linda Church MRN: [id number] DOB/AGE: 1975/11/23 42 y.o.   Date of Admission: 10/10/2017  Date of Discharge:  10/13/17  Admitting Diagnosis: Onset of Labor at [redacted]w[redacted]d  Secondary Diagnosis: Anxiety disorder  Mode of Delivery: primary cesarean section     Discharge Diagnosis: Reasons for cesarean section  Non-reassuring FHR and Malpresentation hyperextended vertex   Intrapartum Procedures: Atificial rupture of membranes and epidural   Post partum procedures: none  Complications: postpartum anemia   Brief Hospital Course    Linda Church is a [id number] who underwent cesarean section on 10/10/2017.  For further details of this surgery, please refer to the operative note. Postpartum course was complicated by urinary retention that has since resolved. By time of discharge on P PD#3, her pain was controlled on oral pain medications; she had appropriate lochia and was ambulating, voiding without difficulty, tolerating regular diet and passing flatus.  She was deemed stable for discharge to home.    Labs: CBC Latest Ref Rng & Units 10/11/2017 10/10/2017 10/10/2017  WBC 4.0 - 10.5 K/uL 13.1(H) 22.0(H) 10.2  Hemoglobin 12.0 - 15.0 g/dL 9.1(L) 11.5(L) 12.6  Hematocrit 36.0 - 46.0 % 25.8(L) 33.8(L) 36.4  Platelets 150 - 400 K/uL 115(L) 127(L) 144(L)   A POS  Physical exam:   Temp:  [97.8 F (36.6 C)-98.6 F (37 C)] 97.8 F (36.6 C) (07/05 0626) Pulse Rate:  [76-96] 76 (07/05 0626) Resp:  [16-18] 18 (07/04 2134) BP: (91-108)/(65-71) 91/71 (07/05 3976)  General: alert and no distress  Lochia: appropriate  Abdomen: soft, NT  Uterine Fundus: firm  Incision: healing well, no significant drainage, no dehiscence, no significant erythema; honeycomb dressing intact  Extremities: No evidence of DVT seen on physical exam. No lower extremity edema.  Discharge Instructions: Per After Visit Summary.  Activity: Advance as tolerated. Pelvic rest  for 6 weeks.  Also refer to After Visit Summary  Diet: Regular  Medications:  Allergies as of 10/13/2017   No Known Allergies     Medication List    TAKE these medications   buPROPion 300 MG 24 hr tablet Commonly known as:  WELLBUTRIN XL Take 300 mg by mouth daily.   escitalopram 20 MG tablet Commonly known as:  LEXAPRO Take 20 mg by mouth daily.   iron polysaccharides 150 MG capsule Commonly known as:  NIFEREX Take 1 capsule (150 mg total) by mouth daily. Start taking on:  10/14/2017   magnesium oxide 400 (241.3 Mg) MG tablet Commonly known as:  MAG-OX Take 1 tablet (400 mg total) by mouth daily. Start taking on:  10/14/2017   oxyCODONE-acetaminophen 5-325 MG tablet Commonly known as:  PERCOCET/ROXICET Take 2 tablets by mouth every 4 (four) hours as needed (pain scale > 7).   PRENATAL+DHA PO Take 1 each by mouth daily.   ranitidine 150 MG tablet Commonly known as:  ZANTAC Take 150 mg by mouth 2 (two) times daily.            Discharge Care Instructions  (From admission, onward)        Start     Ordered   10/13/17 0000  Discharge wound care:    Comments:  Remove honeycomb dressing after five (5) days. Remove sterist   10/13/17 1015     Outpatient follow up:  Follow-up Information    Artelia Laroche, CNM Follow up in 2 day(s).   Specialty:  Obstetrics and Gynecology Why:  Follow up with office in one (1) to two (2) weeks  for incision check Contact information: [address] [place] [address] [phone]          Postpartum contraception: no method; same sex relationship  Discharged Condition: stable  Discharged to: home   Newborn Data:  Disposition:NICU  Apgars: APGAR (1 MIN): 5   APGAR (5 MINS): 7    Baby Feeding: Bottle and Breast   Diona Fanti, CNM [facility] 10/13/17 10:17 AM

## 2017-10-13 NOTE — Discharge Instructions (Signed)
Postpartum Care After Cesarean Delivery °The period of time right after you deliver your newborn is called the postpartum period. °What kind of medical care will I receive? °· You may continue to receive fluids and medicines through an IV tube inserted into one of your veins. °· You may have small, flexible tube (catheter) draining urine from your bladder into a bag outside of your body. The catheter will be removed as soon as possible. °· You may be given a squirt bottle to use when you go to the bathroom. You may use this until you are comfortable wiping as usual. To use the squirt bottle, follow these steps: °? Before you urinate, fill the squirt bottle with warm water. The water should be warm. Do not use hot water. °? After you urinate, while you are sitting on the toilet, use the squirt bottle to rinse the area around your urethra and vaginal opening. This rinses away any urine and blood. °? You may do this instead of wiping. As you start healing, you may use the squirt bottle before wiping yourself. Make sure to wipe gently. °? Fill the squirt bottle with clean water every time you use the bathroom. °· You will be given sanitary pads to wear. °· Your incision will be monitored to make sure it is healing properly. You will be told when it is safe for your stitches, staples, or skin adhesive tape to be removed. °What can I expect? °· You may not feel the need to urinate for several hours after delivery. °· You will have some soreness and pain in your abdomen. You may have a small amount of blood or clear fluid coming from your incision. °· If you are breastfeeding, you may have uterine contractions every time you breastfeed for up to several weeks postpartum. Uterine contractions help your uterus return to its normal size. °· It is normal to have vaginal bleeding (lochia) after delivery. The amount and appearance of lochia is often similar to a menstrual period in the first week after delivery. It will  gradually decrease over the next few weeks to a dry, yellow-brown discharge. For most women, lochia stops completely by 6-8 weeks after delivery. Vaginal bleeding can vary from woman to woman. °· Within the first few days after delivery, you may have breast engorgement. This is when your breasts feel heavy, full, and uncomfortable. Your breasts may also throb and feel hard, tightly stretched, warm, and tender. After this occurs, you may have milk leaking from your breasts. Your health care provider can help you relieve discomfort due to breast engorgement. Breast engorgement should go away within a few days. °· You may feel more sad or worried than normal due to hormonal changes after delivery. These feelings should not last more than a few days. If these feelings do not go away after several days, speak with your health care provider. °How should I care for myself? °· Tell your health care provider if you have pain or discomfort. °· Drink enough water to keep your urine clear or pale yellow. °· Wash your hands thoroughly with soap and water for at least 20 seconds after changing your sanitary pads or using the toilet, and before holding or feeding your baby. °· If you are not breastfeeding, avoid touching your breasts a lot. Doing this can make your breasts produce more milk. °· If you become weak or lightheaded, or you feel like you might faint, ask for help before: °? Getting out of bed. °? Showering. °·   Change your sanitary pads frequently. Watch for any changes in your flow, such as a sudden increase in volume, a change in color, or the passing of large blood clots. If you pass a blood clot from your vagina, save it to show to your health care provider. Do not flush blood clots down the toilet without having your health care provider look at them.  Make sure that all your vaccinations are up to date. This can help protect you and your baby from getting certain diseases. You may need to have immunizations done  before you leave the hospital.  If desired, talk with your health care provider about methods of family planning or birth control (contraception). How can I start bonding with my baby? Spending as much time as possible with your baby is very important. During this time, you and your baby can get to know each other and develop a bond. Having your baby stay with you in your room (rooming in) can give you time to get to know your baby. Rooming in can also help you become comfortable caring for your baby. Breastfeeding can also help you bond with your baby. How can I plan for returning home with my baby?  Make sure that you have a car seat installed in your vehicle. ? Your car seat should be checked by a certified car seat installer to make sure that it is installed safely. ? Make sure that your baby fits into the car seat safely.  Ask your health care provider any questions you have about caring for yourself or your baby. Make sure that you are able to contact your health care provider with any questions after leaving the hospital. This information is not intended to replace advice given to you by your health care provider. Make sure you discuss any questions you have with your health care provider. Document Released: 12/21/2011 Document Revised: 08/31/2015 Document Reviewed: 03/02/2015 Elsevier Interactive Patient Education  2018 [place] Tips If you are breastfeeding, there may be times when you cannot feed your baby directly. Returning to work or going on a trip are examples. Pumping allows you to store breast milk and feed it to your baby later. You may not get much milk when you first start to pump. Your breasts should start to make more after a few days. If you pump at the times you usually feed your baby, you may be able to keep making enough milk to feed your baby without also using formula. The more often you pump, the more milk your body will make. When should I  pump?  You can start to pump soon after you have your baby. Ask your doctor what is right for you and your baby.  If you are going back to work, start pumping a few weeks before. This gives you time to learn how to pump and to store a supply of milk.  When you are with your baby, feed your baby when he or she is hungry. Pump after each feeding.  When you are away from your baby for many hours, pump for about 15 minutes every 2-3 hours. Pump both breasts at the same time if you can.  If your baby has a formula feeding, make sure to pump close to the same time.  If you drink any alcohol, wait 2 hours before pumping. How do I get ready to pump? Your let-down reflex is your body's natural reaction that makes your breast milk flow. It  is easier to make your breast milk flow when you are relaxed. Try these things to help you relax:  Smell one of your baby's blankets or an item of clothing.  Look at a picture or video of your baby.  Sit in a quiet, private space.  Massage the breast you plan to pump.  Place soothing warmth on the breast.  Play relaxing music.  What are some breast pumping tips?  Wash your hands before you pump. You do not need to wash your nipples or breasts.  There are three ways to pump. You can: ? Use your hand to massage and squeeze your breast. ? Use a handheld manual pump. ? Use an electric pump.  Make sure the suction cup on the breast pump is the right size. Place the suction cup directly over the nipple. It can be painful or hurt your nipple if it is the wrong size or placed wrong.  Put a small amount of purified or modified lanolin on your nipple and areola if you are sore.  If you are using an electric pump, change the speed and suction power to be more comfortable.  You may need a different type of pump if pumping hurts or you do not get a lot of milk. Your doctor can help you pick what type of pump to use.  Keep a full water bottle near you always.  Drinking lots of fluid helps you make more milk.  You can store your milk to use later. Pumped breast milk can be stored in a sealable, sterile container or plastic bag. Always put the date you pumped it on the container. ? Milk can stay out at room temperature for up to 8 hours. ? You can store your milk in the refrigerator for up to 8 days. ? You can store your milk in the freezer for 3 months. Thaw frozen milk using warm water. Do not put it in the microwave.  Do not smoke. Ask your doctor for help. When should I call my doctor?  You have a hard time pumping.  You are worried you do not make enough milk.  You have nipple pain, soreness, or redness.  You want to take birth control pills. This information is not intended to replace advice given to you by your health care provider. Make sure you discuss any questions you have with your health care provider. Document Released: 09/14/2007 Document Revised: 09/03/2015 Document Reviewed: 01/18/2013 Elsevier Interactive Patient Education  2017 [place] Instructions for Mom ACTIVITY  Gradually return to your regular activities.  Let yourself rest. Nap while your baby sleeps.  Avoid lifting anything that is heavier than 10 lb (4.5 kg) until your health care provider says it is okay.  Avoid activities that take a lot of effort and energy (are strenuous) until approved by your health care provider. Walking at a slow-to-moderate pace is usually safe.  If you had a cesarean delivery: ? Do not vacuum, climb stairs, or drive a car for 4-6 weeks. ? Have someone help you at home until you feel like you can do your usual activities yourself. ? Do exercises as told by your health care provider, if this applies.  VAGINAL BLEEDING You may continue to bleed for 4-6 weeks after delivery. Over time, the amount of blood usually decreases and the color of the blood usually gets lighter. However, the flow of bright red blood may  increase if you have been too active. If you  need to use more than one pad in an hour because your pad gets soaked, or if you pass a large clot:  Lie down.  Raise your feet.  Place a cold compress on your lower abdomen.  Rest.  Call your health care provider.  If you are breastfeeding, your period should return anytime between 8 weeks after delivery and the time that you stop breastfeeding. If you are not breastfeeding, your period should return 6-8 weeks after delivery. PERINEAL CARE The perineal area, or perineum, is the part of your body between your thighs. After delivery, this area needs special care. Follow these instructions as told by your health care provider.  Take warm tub baths for 15-20 minutes.  Use medicated pads and pain-relieving sprays and creams as told.  Do not use tampons or douches until vaginal bleeding has stopped.  Each time you go to the bathroom: ? Use a peri bottle. ? Change your pad. ? Use towelettes in place of toilet paper until your stitches have healed.  Do Kegel exercises every day. Kegel exercises help to maintain the muscles that support the vagina, bladder, and bowels. You can do these exercises while you are standing, sitting, or lying down. To do Kegel exercises: ? Tighten the muscles of your abdomen and the muscles that surround your birth canal. ? Hold for a few seconds. ? Relax. ? Repeat until you have done this 5 times in a row.  To prevent hemorrhoids from developing or getting worse: ? Drink enough fluid to keep your urine clear or pale yellow. ? Avoid straining when having a bowel movement. ? Take over-the-counter medicines and stool softeners as told by your health care provider.  BREAST CARE  Wear a tight-fitting bra.  Avoid taking over-the-counter pain medicine for breast discomfort.  Apply ice to the breasts to help with discomfort as needed: ? Put ice in a plastic bag. ? Place a towel between your skin and the  bag. ? Leave the ice on for 20 minutes or as told by your health care provider.  NUTRITION  Eat a well-balanced diet.  Do not try to lose weight quickly by cutting back on calories.  Take your prenatal vitamins until your postpartum checkup or until your health care provider tells you to stop.  POSTPARTUM DEPRESSION You may find yourself crying for no apparent reason and unable to cope with all of the changes that come with having a newborn. This mood is called postpartum depression. Postpartum depression happens because your hormone levels change after delivery. If you have postpartum depression, get support from your partner, friends, and family. If the depression does not go away on its own after several weeks, contact your health care provider. BREAST SELF-EXAM Do a breast self-exam each month, at the same time of the month. If you are breastfeeding, check your breasts just after a feeding, when your breasts are less full. If you are breastfeeding and your period has started, check your breasts on day 5, 6, or 7 of your period. Report any lumps, bumps, or discharge to your health care provider. Know that breasts are normally lumpy if you are breastfeeding. This is temporary, and it is not a health risk. INTIMACY AND SEXUALITY Avoid sexual activity for at least 3-4 weeks after delivery or until the brownish-red vaginal flow is completely gone. If you want to avoid pregnancy, use some form of birth control. You can get pregnant after delivery, even if you have not had your period. [place]  CARE IF:  You feel unable to cope with the changes that a child brings to your life, and these feelings do not go away after several weeks.  You notice a lump, a bump, or discharge on your breast.  SEEK IMMEDIATE MEDICAL CARE IF:  Blood soaks your pad in 1 hour or less.  You have: ? Severe pain or cramping in your lower abdomen. ? A bad-smelling vaginal discharge. ? A fever that is not  controlled by medicine. ? A fever, and an area of your breast is red and sore. ? Pain or redness in your calf. ? Sudden, severe chest pain. ? Shortness of breath. ? Painful or bloody urination. ? Problems with your vision.  You vomit for 12 hours or longer.  You develop a severe headache.  You have serious thoughts about hurting yourself, your child, or anyone else.  This information is not intended to replace advice given to you by your health care provider. Make sure you discuss any questions you have with your health care provider. Document Released: 03/25/2000 Document Revised: 09/03/2015 Document Reviewed: 09/29/2014 Elsevier Interactive Patient Education  2017 [employer].

## 2017-10-16 NOTE — H&P (Signed)
OB ADMISSION/ HISTORY & PHYSICAL:  Admission Date: 10/10/2017 11:00 AM  Admit Diagnosis: onset of labor  Linda Church is a 42 y.o. female presenting for labor onset.  Prenatal History: [id number]   EDC : 10/10/2017, Alternate EDD Entry  Prenatal care at [facility]  Primary Ob Provider: CNM Prenatal course complicated by hx traumatic birth with VAVD, anxiety, gestational thrombocytopenia   Prenatal Labs: ABO, Rh: --/--/A POS (07/02 1146) Antibody: NEG (07/02 1146) Rubella: Immune (12/06 0000)  RPR: Non Reactive (07/02 1146)  HBsAg: Negative (12/06 0000)  HIV: Non-reactive (12/06 0000)  GTT: NL GBS: Negative (06/10 0000)   OB History  Gravida Para Term Preterm AB Living  2 2 2     2   SAB TAB Ectopic Multiple Live Births        0 2    # Outcome Date GA Lbr Len/2nd Weight Sex Delivery Anes PTL Lv  2 Term 10/10/17 [redacted]w[redacted]d 06:07 / 00:29  F CS-LTranv Gen, EPI  LIV  1 Term 05/31/15 [redacted]w[redacted]d 27:41 / 01:16 3.23 kg (7 lb 1.9 oz) F Vag-Vacuum EPI  LIV     Birth Comments: wln    Medical / Surgical History :  Past medical history:  Past Medical History:  Diagnosis Date  . Anxiety   . Depression   . Gestational thrombocytopenia without hemorrhage in third trimester ([place]) 05/30/2015  . Indication for care in labor or delivery 05/30/2015     Past surgical history:  Past Surgical History:  Procedure Laterality Date  . CESAREAN SECTION N/A 10/10/2017   Procedure: CESAREAN SECTION;  Surgeon: Servando Salina, MD;  Location: [facility];  Service: Obstetrics;  Laterality: N/A;  . CYSTOSCOPY  10/10/2017   Procedure: CYSTOSCOPY;  Surgeon: Servando Salina, MD;  Location: [facility];  Service: Obstetrics;;  . NO PAST SURGERIES      Family History: History reviewed. No pertinent family history.   Social History:  reports that she has never smoked. She uses smokeless tobacco. She reports that she drinks alcohol. She reports that she does not use  drugs.  Allergies: Patient has no known allergies.    Current Medications at time of admission:  Prior to Admission medications   Medication Sig Start Date End Date Taking? Authorizing Provider  buPROPion (WELLBUTRIN XL) 300 MG 24 hr tablet Take 300 mg by mouth daily. 08/04/17  Yes [provider]  escitalopram (LEXAPRO) 20 MG tablet Take 20 mg by mouth daily. 03/19/15  Yes [provider]  Prenatal MV-Min-Fe Fum-FA-DHA (PRENATAL+DHA PO) Take 1 each by mouth daily.   Yes [provider]  ranitidine (ZANTAC) 150 MG tablet Take 150 mg by mouth 2 (two) times daily.   Yes [provider]   Review of Systems: Active FM onset of ctx  every 3-4 minutes bloody show present  Requesting epidural   Physical Exam: VS: Blood pressure 91/71, pulse 76, temperature 97.8 F (36.6 C), temperature source Oral, resp. rate 18, height 5\' 4"  (1.626 m), weight 66.3 kg (146 lb 1.9 oz), SpO2 100 %, unknown if currently breastfeeding.  General: alert and oriented, appears uncomfortable with ctx - breathing and calm Heart: RRR Lungs: Clear lung fields Abdomen: Gravid, soft and non-tender, non-distended / uterus: gravid and non-tender Extremities: no edema  FHR: baseline rate 135 / variability moderate / accelerations present / no decelerations TOCO: ctx Q 3 minutes  Assessment: [redacted] weeks gestation active stage of labor FHR category 1   Plan:  Admit Epidural Anticipate  SVD  Dr Pamala Hurry notified of admission / plan of care   [place], MSN, [facility] 10/16/2017, 8:58 AM

## 2017-10-19 DIAGNOSIS — F330 Major depressive disorder, recurrent, mild: Secondary | ICD-10-CM | POA: Diagnosis not present

## 2017-10-27 DIAGNOSIS — F53 Postpartum depression: Secondary | ICD-10-CM | POA: Diagnosis not present

## 2017-10-30 DIAGNOSIS — F330 Major depressive disorder, recurrent, mild: Secondary | ICD-10-CM | POA: Diagnosis not present

## 2017-11-09 DIAGNOSIS — F330 Major depressive disorder, recurrent, mild: Secondary | ICD-10-CM | POA: Diagnosis not present

## 2017-11-19 ENCOUNTER — Other Ambulatory Visit: Payer: Self-pay | Admitting: Certified Nurse Midwife

## 2017-11-27 DIAGNOSIS — F330 Major depressive disorder, recurrent, mild: Secondary | ICD-10-CM | POA: Diagnosis not present

## 2017-11-29 DIAGNOSIS — O99345 Other mental disorders complicating the puerperium: Secondary | ICD-10-CM | POA: Diagnosis not present

## 2017-11-29 DIAGNOSIS — Z6822 Body mass index (BMI) 22.0-22.9, adult: Secondary | ICD-10-CM | POA: Diagnosis not present

## 2017-11-29 DIAGNOSIS — F418 Other specified anxiety disorders: Secondary | ICD-10-CM | POA: Diagnosis not present

## 2017-12-07 DIAGNOSIS — F330 Major depressive disorder, recurrent, mild: Secondary | ICD-10-CM | POA: Diagnosis not present

## 2017-12-08 DIAGNOSIS — M6200 Separation of muscle (nontraumatic), unspecified site: Secondary | ICD-10-CM | POA: Diagnosis not present

## 2017-12-08 DIAGNOSIS — M545 Low back pain: Secondary | ICD-10-CM | POA: Diagnosis not present

## 2017-12-08 DIAGNOSIS — R531 Weakness: Secondary | ICD-10-CM | POA: Diagnosis not present

## 2017-12-08 DIAGNOSIS — L905 Scar conditions and fibrosis of skin: Secondary | ICD-10-CM | POA: Diagnosis not present

## 2017-12-13 DIAGNOSIS — F330 Major depressive disorder, recurrent, mild: Secondary | ICD-10-CM | POA: Diagnosis not present

## 2017-12-27 DIAGNOSIS — F330 Major depressive disorder, recurrent, mild: Secondary | ICD-10-CM | POA: Diagnosis not present

## 2018-01-02 DIAGNOSIS — M545 Low back pain: Secondary | ICD-10-CM | POA: Diagnosis not present

## 2018-01-02 DIAGNOSIS — M6200 Separation of muscle (nontraumatic), unspecified site: Secondary | ICD-10-CM | POA: Diagnosis not present

## 2018-01-02 DIAGNOSIS — L905 Scar conditions and fibrosis of skin: Secondary | ICD-10-CM | POA: Diagnosis not present

## 2018-01-02 DIAGNOSIS — R531 Weakness: Secondary | ICD-10-CM | POA: Diagnosis not present

## 2018-01-16 DIAGNOSIS — M545 Low back pain: Secondary | ICD-10-CM | POA: Diagnosis not present

## 2018-01-16 DIAGNOSIS — M6200 Separation of muscle (nontraumatic), unspecified site: Secondary | ICD-10-CM | POA: Diagnosis not present

## 2018-01-16 DIAGNOSIS — L905 Scar conditions and fibrosis of skin: Secondary | ICD-10-CM | POA: Diagnosis not present

## 2018-01-16 DIAGNOSIS — R531 Weakness: Secondary | ICD-10-CM | POA: Diagnosis not present

## 2018-01-26 DIAGNOSIS — R531 Weakness: Secondary | ICD-10-CM | POA: Diagnosis not present

## 2018-01-26 DIAGNOSIS — L905 Scar conditions and fibrosis of skin: Secondary | ICD-10-CM | POA: Diagnosis not present

## 2018-01-26 DIAGNOSIS — M545 Low back pain: Secondary | ICD-10-CM | POA: Diagnosis not present

## 2018-01-26 DIAGNOSIS — M6200 Separation of muscle (nontraumatic), unspecified site: Secondary | ICD-10-CM | POA: Diagnosis not present

## 2018-02-13 DIAGNOSIS — F330 Major depressive disorder, recurrent, mild: Secondary | ICD-10-CM | POA: Diagnosis not present

## 2018-02-16 DIAGNOSIS — M6200 Separation of muscle (nontraumatic), unspecified site: Secondary | ICD-10-CM | POA: Diagnosis not present

## 2018-02-16 DIAGNOSIS — R531 Weakness: Secondary | ICD-10-CM | POA: Diagnosis not present

## 2018-02-16 DIAGNOSIS — M545 Low back pain: Secondary | ICD-10-CM | POA: Diagnosis not present

## 2018-02-16 DIAGNOSIS — L905 Scar conditions and fibrosis of skin: Secondary | ICD-10-CM | POA: Diagnosis not present

## 2018-03-27 DIAGNOSIS — F330 Major depressive disorder, recurrent, mild: Secondary | ICD-10-CM | POA: Diagnosis not present

## 2018-04-24 DIAGNOSIS — F330 Major depressive disorder, recurrent, mild: Secondary | ICD-10-CM | POA: Diagnosis not present

## 2018-05-08 DIAGNOSIS — F330 Major depressive disorder, recurrent, mild: Secondary | ICD-10-CM | POA: Diagnosis not present

## 2018-05-29 DIAGNOSIS — F330 Major depressive disorder, recurrent, mild: Secondary | ICD-10-CM | POA: Diagnosis not present

## 2018-07-07 DIAGNOSIS — F330 Major depressive disorder, recurrent, mild: Secondary | ICD-10-CM | POA: Diagnosis not present

## 2018-07-11 DIAGNOSIS — F330 Major depressive disorder, recurrent, mild: Secondary | ICD-10-CM | POA: Diagnosis not present

## 2018-07-24 DIAGNOSIS — F330 Major depressive disorder, recurrent, mild: Secondary | ICD-10-CM | POA: Diagnosis not present

## 2018-09-11 DIAGNOSIS — Z20828 Contact with and (suspected) exposure to other viral communicable diseases: Secondary | ICD-10-CM | POA: Diagnosis not present

## 2019-11-22 DIAGNOSIS — Z1152 Encounter for screening for COVID-19: Secondary | ICD-10-CM | POA: Diagnosis not present

## 2019-12-12 DIAGNOSIS — F330 Major depressive disorder, recurrent, mild: Secondary | ICD-10-CM | POA: Diagnosis not present

## 2019-12-12 DIAGNOSIS — F419 Anxiety disorder, unspecified: Secondary | ICD-10-CM | POA: Diagnosis not present

## 2019-12-19 DIAGNOSIS — F419 Anxiety disorder, unspecified: Secondary | ICD-10-CM | POA: Diagnosis not present

## 2019-12-19 DIAGNOSIS — F330 Major depressive disorder, recurrent, mild: Secondary | ICD-10-CM | POA: Diagnosis not present

## 2019-12-30 DIAGNOSIS — F330 Major depressive disorder, recurrent, mild: Secondary | ICD-10-CM | POA: Diagnosis not present

## 2019-12-30 DIAGNOSIS — F419 Anxiety disorder, unspecified: Secondary | ICD-10-CM | POA: Diagnosis not present

## 2020-01-02 DIAGNOSIS — Z1231 Encounter for screening mammogram for malignant neoplasm of breast: Secondary | ICD-10-CM | POA: Diagnosis not present

## 2020-01-09 DIAGNOSIS — F330 Major depressive disorder, recurrent, mild: Secondary | ICD-10-CM | POA: Diagnosis not present

## 2020-01-09 DIAGNOSIS — F419 Anxiety disorder, unspecified: Secondary | ICD-10-CM | POA: Diagnosis not present

## 2020-01-20 DIAGNOSIS — Z03818 Encounter for observation for suspected exposure to other biological agents ruled out: Secondary | ICD-10-CM | POA: Diagnosis not present

## 2020-02-03 DIAGNOSIS — F419 Anxiety disorder, unspecified: Secondary | ICD-10-CM | POA: Diagnosis not present

## 2020-02-03 DIAGNOSIS — F330 Major depressive disorder, recurrent, mild: Secondary | ICD-10-CM | POA: Diagnosis not present

## 2020-02-10 DIAGNOSIS — F419 Anxiety disorder, unspecified: Secondary | ICD-10-CM | POA: Diagnosis not present

## 2020-02-10 DIAGNOSIS — F331 Major depressive disorder, recurrent, moderate: Secondary | ICD-10-CM | POA: Diagnosis not present

## 2020-02-16 DIAGNOSIS — J029 Acute pharyngitis, unspecified: Secondary | ICD-10-CM | POA: Diagnosis not present

## 2020-02-16 DIAGNOSIS — Z20822 Contact with and (suspected) exposure to covid-19: Secondary | ICD-10-CM | POA: Diagnosis not present

## 2020-02-29 DIAGNOSIS — J069 Acute upper respiratory infection, unspecified: Secondary | ICD-10-CM | POA: Diagnosis not present

## 2020-02-29 DIAGNOSIS — R059 Cough, unspecified: Secondary | ICD-10-CM | POA: Diagnosis not present

## 2020-03-16 DIAGNOSIS — R059 Cough, unspecified: Secondary | ICD-10-CM | POA: Diagnosis not present

## 2020-03-16 DIAGNOSIS — J209 Acute bronchitis, unspecified: Secondary | ICD-10-CM | POA: Diagnosis not present

## 2020-03-19 DIAGNOSIS — F330 Major depressive disorder, recurrent, mild: Secondary | ICD-10-CM | POA: Diagnosis not present

## 2020-03-19 DIAGNOSIS — F419 Anxiety disorder, unspecified: Secondary | ICD-10-CM | POA: Diagnosis not present

## 2020-04-06 DIAGNOSIS — F330 Major depressive disorder, recurrent, mild: Secondary | ICD-10-CM | POA: Diagnosis not present

## 2020-04-06 DIAGNOSIS — F419 Anxiety disorder, unspecified: Secondary | ICD-10-CM | POA: Diagnosis not present

## 2020-04-20 DIAGNOSIS — F330 Major depressive disorder, recurrent, mild: Secondary | ICD-10-CM | POA: Diagnosis not present

## 2020-04-20 DIAGNOSIS — F419 Anxiety disorder, unspecified: Secondary | ICD-10-CM | POA: Diagnosis not present

## 2020-04-27 DIAGNOSIS — F411 Generalized anxiety disorder: Secondary | ICD-10-CM | POA: Diagnosis not present

## 2020-04-27 DIAGNOSIS — F331 Major depressive disorder, recurrent, moderate: Secondary | ICD-10-CM | POA: Diagnosis not present

## 2020-04-27 DIAGNOSIS — G4700 Insomnia, unspecified: Secondary | ICD-10-CM | POA: Diagnosis not present

## 2020-05-04 DIAGNOSIS — F419 Anxiety disorder, unspecified: Secondary | ICD-10-CM | POA: Diagnosis not present

## 2020-05-04 DIAGNOSIS — F331 Major depressive disorder, recurrent, moderate: Secondary | ICD-10-CM | POA: Diagnosis not present

## 2020-05-04 DIAGNOSIS — F330 Major depressive disorder, recurrent, mild: Secondary | ICD-10-CM | POA: Diagnosis not present

## 2020-05-18 DIAGNOSIS — F330 Major depressive disorder, recurrent, mild: Secondary | ICD-10-CM | POA: Diagnosis not present

## 2020-05-18 DIAGNOSIS — F419 Anxiety disorder, unspecified: Secondary | ICD-10-CM | POA: Diagnosis not present

## 2020-06-04 DIAGNOSIS — F330 Major depressive disorder, recurrent, mild: Secondary | ICD-10-CM | POA: Diagnosis not present

## 2020-06-04 DIAGNOSIS — F419 Anxiety disorder, unspecified: Secondary | ICD-10-CM | POA: Diagnosis not present

## 2020-06-15 DIAGNOSIS — F419 Anxiety disorder, unspecified: Secondary | ICD-10-CM | POA: Diagnosis not present

## 2020-06-15 DIAGNOSIS — F330 Major depressive disorder, recurrent, mild: Secondary | ICD-10-CM | POA: Diagnosis not present

## 2020-07-02 DIAGNOSIS — F330 Major depressive disorder, recurrent, mild: Secondary | ICD-10-CM | POA: Diagnosis not present

## 2020-07-02 DIAGNOSIS — F419 Anxiety disorder, unspecified: Secondary | ICD-10-CM | POA: Diagnosis not present

## 2020-08-10 DIAGNOSIS — F419 Anxiety disorder, unspecified: Secondary | ICD-10-CM | POA: Diagnosis not present

## 2020-08-10 DIAGNOSIS — Z0000 Encounter for general adult medical examination without abnormal findings: Secondary | ICD-10-CM | POA: Diagnosis not present

## 2020-08-10 DIAGNOSIS — F331 Major depressive disorder, recurrent, moderate: Secondary | ICD-10-CM | POA: Diagnosis not present

## 2020-08-13 DIAGNOSIS — Z0000 Encounter for general adult medical examination without abnormal findings: Secondary | ICD-10-CM | POA: Diagnosis not present

## 2020-08-13 DIAGNOSIS — Z23 Encounter for immunization: Secondary | ICD-10-CM | POA: Diagnosis not present

## 2020-08-13 DIAGNOSIS — M222X9 Patellofemoral disorders, unspecified knee: Secondary | ICD-10-CM | POA: Diagnosis not present

## 2020-08-13 DIAGNOSIS — M722 Plantar fascial fibromatosis: Secondary | ICD-10-CM | POA: Diagnosis not present

## 2020-08-31 DIAGNOSIS — F330 Major depressive disorder, recurrent, mild: Secondary | ICD-10-CM | POA: Diagnosis not present

## 2020-08-31 DIAGNOSIS — F419 Anxiety disorder, unspecified: Secondary | ICD-10-CM | POA: Diagnosis not present

## 2020-09-03 DIAGNOSIS — Z20822 Contact with and (suspected) exposure to covid-19: Secondary | ICD-10-CM | POA: Diagnosis not present

## 2020-09-07 DIAGNOSIS — F330 Major depressive disorder, recurrent, mild: Secondary | ICD-10-CM | POA: Diagnosis not present

## 2020-09-07 DIAGNOSIS — F419 Anxiety disorder, unspecified: Secondary | ICD-10-CM | POA: Diagnosis not present

## 2020-09-14 ENCOUNTER — Other Ambulatory Visit: Payer: Self-pay

## 2020-09-14 ENCOUNTER — Ambulatory Visit (INDEPENDENT_AMBULATORY_CARE_PROVIDER_SITE_OTHER): Payer: BLUE CROSS/BLUE SHIELD

## 2020-09-14 ENCOUNTER — Ambulatory Visit (INDEPENDENT_AMBULATORY_CARE_PROVIDER_SITE_OTHER): Payer: BLUE CROSS/BLUE SHIELD | Admitting: Podiatry

## 2020-09-14 DIAGNOSIS — M722 Plantar fascial fibromatosis: Secondary | ICD-10-CM

## 2020-09-14 DIAGNOSIS — M79671 Pain in right foot: Secondary | ICD-10-CM

## 2020-09-14 DIAGNOSIS — M79672 Pain in left foot: Secondary | ICD-10-CM

## 2020-09-14 DIAGNOSIS — M779 Enthesopathy, unspecified: Secondary | ICD-10-CM | POA: Diagnosis not present

## 2020-09-14 DIAGNOSIS — D3610 Benign neoplasm of peripheral nerves and autonomic nervous system, unspecified: Secondary | ICD-10-CM | POA: Diagnosis not present

## 2020-09-14 MED ORDER — TRIAMCINOLONE ACETONIDE 10 MG/ML IJ SUSP
10.0000 mg | Freq: Once | INTRAMUSCULAR | Status: AC
  Administered 2020-09-14: 10 mg

## 2020-09-14 MED ORDER — MELOXICAM 15 MG PO TABS: 15.0000 mg | ORAL_TABLET | Freq: Every day | ORAL | 2 refills | Status: AC

## 2020-09-14 NOTE — Patient Instructions (Signed)

## 2020-09-15 NOTE — Progress Notes (Signed)
Subjective:   Patient ID: Linda Church, female   DOB: 45 y.o.   MRN: [id number]   HPI Patient states she has had forefoot pain bilateral for a number of years and has had discomfort with worsening of intensification of pain in the plantar heel right.  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  Patient does not smoke likes to be active   Review of Systems  All other systems reviewed and are negative.       Objective:  Physical Exam Vitals and nursing note reviewed.  Constitutional:      Appearance: She is well-developed.  Pulmonary:     Effort: Pulmonary effort is normal.  Musculoskeletal:        General: Normal range of motion.  Skin:    General: Skin is warm.  Neurological:     Mental Status: She is alert.     Neurovascular status intact muscle strength adequate range of motion within normal limits.  Patient is found to have exquisite discomfort plantar aspect of the right heel at the insertional point tendon calcaneus with inflammation fluid buildup and mild forefoot pain bilateral with negative Homans' sign and only mild discomfort around the lesser MPJs with flexible foot structure     Assessment:  Inflammatory fasciitis right along with low-grade capsulitis bilateral     Plan:  H&P reviewed conditions discussed with Benjamine Mola at great length.  At this time amount of focus on the right heel and I did discuss long-term orthotics and at this time I went ahead and I did sterile prep and injected the plantar fascial right 3 mg Kenalog 5 mg Xylocaine applied fascial brace to lift up the arch and I then discussed metatarsal padding to try to take pressure off the metatarsals with consideration for long-term orthotics  X-rays indicate that there is moderate depression of the arch bilateral with no other inflammatory  condition noted with small spur formation no stress fracture noted

## 2020-09-28 ENCOUNTER — Other Ambulatory Visit: Payer: Self-pay

## 2020-09-28 ENCOUNTER — Ambulatory Visit (INDEPENDENT_AMBULATORY_CARE_PROVIDER_SITE_OTHER): Payer: BLUE CROSS/BLUE SHIELD | Admitting: Podiatry

## 2020-09-28 ENCOUNTER — Encounter: Payer: Self-pay | Admitting: Podiatry

## 2020-09-28 DIAGNOSIS — D3610 Benign neoplasm of peripheral nerves and autonomic nervous system, unspecified: Secondary | ICD-10-CM | POA: Diagnosis not present

## 2020-09-28 DIAGNOSIS — M722 Plantar fascial fibromatosis: Secondary | ICD-10-CM

## 2020-09-28 DIAGNOSIS — M779 Enthesopathy, unspecified: Secondary | ICD-10-CM

## 2020-09-28 NOTE — Progress Notes (Signed)
Subjective:   Patient ID: Linda Church, female   DOB: 45 y.o.   MRN: [id number]   HPI Patient presents stating she has had improvement but still has pain in her heel and more to the outside of the heel.  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  Patient is found to have good digital perfusion well oriented x3     Assessment:  Continuation of acute plantar fasciitis right that has improved still present with mechanical dysfunction     Plan:  H&P reviewed condition recommended long-term orthotics and patient is scanned for customized orthotic devices.  Patient will be seen back when returned and will see me if any symptoms are present continuing supportive therapy anti-inflammatories and stretch

## 2020-10-21 ENCOUNTER — Telehealth: Payer: Self-pay | Admitting: Podiatry

## 2020-10-21 NOTE — Telephone Encounter (Signed)
Orthotics in.. lvm for pt to let me know if she wants an appt to pick them up or if she just wants to pick them up and try them as she has had them before.

## 2020-10-22 NOTE — Telephone Encounter (Signed)
Pt returned call and would like to just pick up the orthotics.

## 2020-10-29 DIAGNOSIS — F331 Major depressive disorder, recurrent, moderate: Secondary | ICD-10-CM | POA: Diagnosis not present

## 2020-10-29 DIAGNOSIS — F419 Anxiety disorder, unspecified: Secondary | ICD-10-CM | POA: Diagnosis not present

## 2020-11-02 DIAGNOSIS — F419 Anxiety disorder, unspecified: Secondary | ICD-10-CM | POA: Diagnosis not present

## 2020-11-02 DIAGNOSIS — F330 Major depressive disorder, recurrent, mild: Secondary | ICD-10-CM | POA: Diagnosis not present

## 2020-11-13 DIAGNOSIS — S161XXA Strain of muscle, fascia and tendon at neck level, initial encounter: Secondary | ICD-10-CM | POA: Diagnosis not present

## 2020-11-13 DIAGNOSIS — M542 Cervicalgia: Secondary | ICD-10-CM | POA: Diagnosis not present

## 2021-01-14 DIAGNOSIS — Z1231 Encounter for screening mammogram for malignant neoplasm of breast: Secondary | ICD-10-CM | POA: Diagnosis not present

## 2021-01-14 DIAGNOSIS — Z01419 Encounter for gynecological examination (general) (routine) without abnormal findings: Secondary | ICD-10-CM | POA: Diagnosis not present

## 2021-01-14 DIAGNOSIS — Z6822 Body mass index (BMI) 22.0-22.9, adult: Secondary | ICD-10-CM | POA: Diagnosis not present

## 2021-03-08 DIAGNOSIS — J069 Acute upper respiratory infection, unspecified: Secondary | ICD-10-CM | POA: Diagnosis not present

## 2021-03-08 DIAGNOSIS — J329 Chronic sinusitis, unspecified: Secondary | ICD-10-CM | POA: Diagnosis not present

## 2021-03-08 DIAGNOSIS — B9689 Other specified bacterial agents as the cause of diseases classified elsewhere: Secondary | ICD-10-CM | POA: Diagnosis not present

## 2021-03-08 DIAGNOSIS — H6692 Otitis media, unspecified, left ear: Secondary | ICD-10-CM | POA: Diagnosis not present

## 2021-03-16 DIAGNOSIS — F331 Major depressive disorder, recurrent, moderate: Secondary | ICD-10-CM | POA: Diagnosis not present

## 2021-03-16 DIAGNOSIS — F419 Anxiety disorder, unspecified: Secondary | ICD-10-CM | POA: Diagnosis not present

## 2021-03-25 DIAGNOSIS — Z23 Encounter for immunization: Secondary | ICD-10-CM | POA: Diagnosis not present

## 2021-04-15 DIAGNOSIS — F419 Anxiety disorder, unspecified: Secondary | ICD-10-CM | POA: Diagnosis not present

## 2021-04-15 DIAGNOSIS — F330 Major depressive disorder, recurrent, mild: Secondary | ICD-10-CM | POA: Diagnosis not present

## 2021-06-10 DIAGNOSIS — F331 Major depressive disorder, recurrent, moderate: Secondary | ICD-10-CM | POA: Diagnosis not present

## 2021-06-10 DIAGNOSIS — F419 Anxiety disorder, unspecified: Secondary | ICD-10-CM | POA: Diagnosis not present

## 2021-08-27 DIAGNOSIS — F419 Anxiety disorder, unspecified: Secondary | ICD-10-CM | POA: Diagnosis not present

## 2021-08-27 DIAGNOSIS — F330 Major depressive disorder, recurrent, mild: Secondary | ICD-10-CM | POA: Diagnosis not present

## 2021-09-06 DIAGNOSIS — F419 Anxiety disorder, unspecified: Secondary | ICD-10-CM | POA: Diagnosis not present

## 2021-09-06 DIAGNOSIS — F330 Major depressive disorder, recurrent, mild: Secondary | ICD-10-CM | POA: Diagnosis not present

## 2021-09-07 DIAGNOSIS — F419 Anxiety disorder, unspecified: Secondary | ICD-10-CM | POA: Diagnosis not present

## 2021-09-07 DIAGNOSIS — F331 Major depressive disorder, recurrent, moderate: Secondary | ICD-10-CM | POA: Diagnosis not present

## 2021-09-27 DIAGNOSIS — F419 Anxiety disorder, unspecified: Secondary | ICD-10-CM | POA: Diagnosis not present

## 2021-09-27 DIAGNOSIS — F330 Major depressive disorder, recurrent, mild: Secondary | ICD-10-CM | POA: Diagnosis not present

## 2021-10-04 DIAGNOSIS — F419 Anxiety disorder, unspecified: Secondary | ICD-10-CM | POA: Diagnosis not present

## 2021-10-04 DIAGNOSIS — F331 Major depressive disorder, recurrent, moderate: Secondary | ICD-10-CM | POA: Diagnosis not present

## 2021-10-21 DIAGNOSIS — Z0000 Encounter for general adult medical examination without abnormal findings: Secondary | ICD-10-CM | POA: Diagnosis not present

## 2021-10-25 DIAGNOSIS — Z0000 Encounter for general adult medical examination without abnormal findings: Secondary | ICD-10-CM | POA: Diagnosis not present

## 2021-12-06 DIAGNOSIS — S39012A Strain of muscle, fascia and tendon of lower back, initial encounter: Secondary | ICD-10-CM | POA: Diagnosis not present

## 2021-12-29 DIAGNOSIS — F331 Major depressive disorder, recurrent, moderate: Secondary | ICD-10-CM | POA: Diagnosis not present

## 2021-12-29 DIAGNOSIS — F419 Anxiety disorder, unspecified: Secondary | ICD-10-CM | POA: Diagnosis not present

## 2022-01-03 DIAGNOSIS — F330 Major depressive disorder, recurrent, mild: Secondary | ICD-10-CM | POA: Diagnosis not present

## 2022-01-03 DIAGNOSIS — F419 Anxiety disorder, unspecified: Secondary | ICD-10-CM | POA: Diagnosis not present

## 2022-02-03 DIAGNOSIS — F419 Anxiety disorder, unspecified: Secondary | ICD-10-CM | POA: Diagnosis not present

## 2022-02-03 DIAGNOSIS — F330 Major depressive disorder, recurrent, mild: Secondary | ICD-10-CM | POA: Diagnosis not present

## 2022-02-14 DIAGNOSIS — F331 Major depressive disorder, recurrent, moderate: Secondary | ICD-10-CM | POA: Diagnosis not present

## 2022-02-14 DIAGNOSIS — F419 Anxiety disorder, unspecified: Secondary | ICD-10-CM | POA: Diagnosis not present

## 2022-02-17 DIAGNOSIS — B9689 Other specified bacterial agents as the cause of diseases classified elsewhere: Secondary | ICD-10-CM | POA: Diagnosis not present

## 2022-02-17 DIAGNOSIS — J40 Bronchitis, not specified as acute or chronic: Secondary | ICD-10-CM | POA: Diagnosis not present

## 2022-02-17 DIAGNOSIS — J069 Acute upper respiratory infection, unspecified: Secondary | ICD-10-CM | POA: Diagnosis not present

## 2022-02-17 DIAGNOSIS — J329 Chronic sinusitis, unspecified: Secondary | ICD-10-CM | POA: Diagnosis not present

## 2022-02-23 DIAGNOSIS — F419 Anxiety disorder, unspecified: Secondary | ICD-10-CM | POA: Diagnosis not present

## 2022-02-23 DIAGNOSIS — F330 Major depressive disorder, recurrent, mild: Secondary | ICD-10-CM | POA: Diagnosis not present

## 2022-03-08 DIAGNOSIS — F330 Major depressive disorder, recurrent, mild: Secondary | ICD-10-CM | POA: Diagnosis not present

## 2022-03-08 DIAGNOSIS — F419 Anxiety disorder, unspecified: Secondary | ICD-10-CM | POA: Diagnosis not present

## 2022-03-16 DIAGNOSIS — J329 Chronic sinusitis, unspecified: Secondary | ICD-10-CM | POA: Diagnosis not present

## 2022-03-16 DIAGNOSIS — J069 Acute upper respiratory infection, unspecified: Secondary | ICD-10-CM | POA: Diagnosis not present

## 2022-03-16 DIAGNOSIS — B9689 Other specified bacterial agents as the cause of diseases classified elsewhere: Secondary | ICD-10-CM | POA: Diagnosis not present

## 2022-03-17 DIAGNOSIS — Z6823 Body mass index (BMI) 23.0-23.9, adult: Secondary | ICD-10-CM | POA: Diagnosis not present

## 2022-03-17 DIAGNOSIS — Z1231 Encounter for screening mammogram for malignant neoplasm of breast: Secondary | ICD-10-CM | POA: Diagnosis not present

## 2022-03-17 DIAGNOSIS — Z01419 Encounter for gynecological examination (general) (routine) without abnormal findings: Secondary | ICD-10-CM | POA: Diagnosis not present

## 2022-03-22 DIAGNOSIS — F419 Anxiety disorder, unspecified: Secondary | ICD-10-CM | POA: Diagnosis not present

## 2022-03-22 DIAGNOSIS — F330 Major depressive disorder, recurrent, mild: Secondary | ICD-10-CM | POA: Diagnosis not present

## 2022-05-12 DIAGNOSIS — F331 Major depressive disorder, recurrent, moderate: Secondary | ICD-10-CM | POA: Diagnosis not present

## 2022-05-12 DIAGNOSIS — F419 Anxiety disorder, unspecified: Secondary | ICD-10-CM | POA: Diagnosis not present

## 2022-07-01 DIAGNOSIS — R059 Cough, unspecified: Secondary | ICD-10-CM | POA: Diagnosis not present

## 2022-07-01 DIAGNOSIS — R0981 Nasal congestion: Secondary | ICD-10-CM | POA: Diagnosis not present

## 2022-07-01 DIAGNOSIS — B349 Viral infection, unspecified: Secondary | ICD-10-CM | POA: Diagnosis not present

## 2022-08-08 DIAGNOSIS — F331 Major depressive disorder, recurrent, moderate: Secondary | ICD-10-CM | POA: Diagnosis not present

## 2022-08-08 DIAGNOSIS — F419 Anxiety disorder, unspecified: Secondary | ICD-10-CM | POA: Diagnosis not present

## 2022-10-17 DIAGNOSIS — F330 Major depressive disorder, recurrent, mild: Secondary | ICD-10-CM | POA: Diagnosis not present

## 2022-10-17 DIAGNOSIS — F419 Anxiety disorder, unspecified: Secondary | ICD-10-CM | POA: Diagnosis not present

## 2022-11-03 DIAGNOSIS — M2141 Flat foot [pes planus] (acquired), right foot: Secondary | ICD-10-CM | POA: Diagnosis not present

## 2022-11-03 DIAGNOSIS — M7742 Metatarsalgia, left foot: Secondary | ICD-10-CM | POA: Diagnosis not present

## 2022-11-03 DIAGNOSIS — Z0000 Encounter for general adult medical examination without abnormal findings: Secondary | ICD-10-CM | POA: Diagnosis not present

## 2022-11-03 DIAGNOSIS — M2142 Flat foot [pes planus] (acquired), left foot: Secondary | ICD-10-CM | POA: Diagnosis not present

## 2022-11-03 DIAGNOSIS — M7741 Metatarsalgia, right foot: Secondary | ICD-10-CM | POA: Diagnosis not present

## 2022-11-24 DIAGNOSIS — F419 Anxiety disorder, unspecified: Secondary | ICD-10-CM | POA: Diagnosis not present

## 2022-11-24 DIAGNOSIS — F331 Major depressive disorder, recurrent, moderate: Secondary | ICD-10-CM | POA: Diagnosis not present

## 2023-01-26 DIAGNOSIS — F419 Anxiety disorder, unspecified: Secondary | ICD-10-CM | POA: Diagnosis not present

## 2023-01-26 DIAGNOSIS — F330 Major depressive disorder, recurrent, mild: Secondary | ICD-10-CM | POA: Diagnosis not present

## 2023-02-09 DIAGNOSIS — M7742 Metatarsalgia, left foot: Secondary | ICD-10-CM | POA: Diagnosis not present

## 2023-02-09 DIAGNOSIS — M7741 Metatarsalgia, right foot: Secondary | ICD-10-CM | POA: Diagnosis not present

## 2023-02-20 DIAGNOSIS — F419 Anxiety disorder, unspecified: Secondary | ICD-10-CM | POA: Diagnosis not present

## 2023-02-20 DIAGNOSIS — F331 Major depressive disorder, recurrent, moderate: Secondary | ICD-10-CM | POA: Diagnosis not present

## 2023-02-23 DIAGNOSIS — F330 Major depressive disorder, recurrent, mild: Secondary | ICD-10-CM | POA: Diagnosis not present

## 2023-02-23 DIAGNOSIS — F419 Anxiety disorder, unspecified: Secondary | ICD-10-CM | POA: Diagnosis not present

## 2023-03-02 DIAGNOSIS — F419 Anxiety disorder, unspecified: Secondary | ICD-10-CM | POA: Diagnosis not present

## 2023-03-02 DIAGNOSIS — F330 Major depressive disorder, recurrent, mild: Secondary | ICD-10-CM | POA: Diagnosis not present

## 2023-03-16 DIAGNOSIS — Z1159 Encounter for screening for other viral diseases: Secondary | ICD-10-CM | POA: Diagnosis not present

## 2023-03-16 DIAGNOSIS — J329 Chronic sinusitis, unspecified: Secondary | ICD-10-CM | POA: Diagnosis not present

## 2023-03-16 DIAGNOSIS — B9689 Other specified bacterial agents as the cause of diseases classified elsewhere: Secondary | ICD-10-CM | POA: Diagnosis not present

## 2023-03-16 DIAGNOSIS — J069 Acute upper respiratory infection, unspecified: Secondary | ICD-10-CM | POA: Diagnosis not present

## 2023-03-20 DIAGNOSIS — F419 Anxiety disorder, unspecified: Secondary | ICD-10-CM | POA: Diagnosis not present

## 2023-03-20 DIAGNOSIS — F330 Major depressive disorder, recurrent, mild: Secondary | ICD-10-CM | POA: Diagnosis not present

## 2023-03-30 DIAGNOSIS — F330 Major depressive disorder, recurrent, mild: Secondary | ICD-10-CM | POA: Diagnosis not present

## 2023-03-30 DIAGNOSIS — F419 Anxiety disorder, unspecified: Secondary | ICD-10-CM | POA: Diagnosis not present

## 2023-04-04 DIAGNOSIS — F1010 Alcohol abuse, uncomplicated: Secondary | ICD-10-CM | POA: Diagnosis not present

## 2023-04-04 DIAGNOSIS — F411 Generalized anxiety disorder: Secondary | ICD-10-CM | POA: Diagnosis not present

## 2023-04-04 DIAGNOSIS — F419 Anxiety disorder, unspecified: Secondary | ICD-10-CM | POA: Diagnosis not present

## 2023-04-04 DIAGNOSIS — F331 Major depressive disorder, recurrent, moderate: Secondary | ICD-10-CM | POA: Diagnosis not present

## 2023-04-27 DIAGNOSIS — F330 Major depressive disorder, recurrent, mild: Secondary | ICD-10-CM | POA: Diagnosis not present

## 2023-04-27 DIAGNOSIS — F419 Anxiety disorder, unspecified: Secondary | ICD-10-CM | POA: Diagnosis not present

## 2023-05-11 DIAGNOSIS — F330 Major depressive disorder, recurrent, mild: Secondary | ICD-10-CM | POA: Diagnosis not present

## 2023-05-11 DIAGNOSIS — F419 Anxiety disorder, unspecified: Secondary | ICD-10-CM | POA: Diagnosis not present

## 2023-06-08 DIAGNOSIS — F419 Anxiety disorder, unspecified: Secondary | ICD-10-CM | POA: Diagnosis not present

## 2023-06-08 DIAGNOSIS — F330 Major depressive disorder, recurrent, mild: Secondary | ICD-10-CM | POA: Diagnosis not present

## 2023-06-12 DIAGNOSIS — Z79899 Other long term (current) drug therapy: Secondary | ICD-10-CM | POA: Diagnosis not present

## 2023-06-22 DIAGNOSIS — F330 Major depressive disorder, recurrent, mild: Secondary | ICD-10-CM | POA: Diagnosis not present

## 2023-06-22 DIAGNOSIS — F419 Anxiety disorder, unspecified: Secondary | ICD-10-CM | POA: Diagnosis not present

## 2023-07-06 DIAGNOSIS — F330 Major depressive disorder, recurrent, mild: Secondary | ICD-10-CM | POA: Diagnosis not present

## 2023-07-06 DIAGNOSIS — F419 Anxiety disorder, unspecified: Secondary | ICD-10-CM | POA: Diagnosis not present

## 2023-07-10 DIAGNOSIS — F419 Anxiety disorder, unspecified: Secondary | ICD-10-CM | POA: Diagnosis not present

## 2023-07-10 DIAGNOSIS — F331 Major depressive disorder, recurrent, moderate: Secondary | ICD-10-CM | POA: Diagnosis not present

## 2023-07-20 DIAGNOSIS — F330 Major depressive disorder, recurrent, mild: Secondary | ICD-10-CM | POA: Diagnosis not present

## 2023-07-20 DIAGNOSIS — F419 Anxiety disorder, unspecified: Secondary | ICD-10-CM | POA: Diagnosis not present

## 2023-08-03 DIAGNOSIS — F419 Anxiety disorder, unspecified: Secondary | ICD-10-CM | POA: Diagnosis not present

## 2023-09-21 DIAGNOSIS — F419 Anxiety disorder, unspecified: Secondary | ICD-10-CM | POA: Diagnosis not present

## 2023-09-21 DIAGNOSIS — F331 Major depressive disorder, recurrent, moderate: Secondary | ICD-10-CM | POA: Diagnosis not present

## 2023-11-13 DIAGNOSIS — Z0000 Encounter for general adult medical examination without abnormal findings: Secondary | ICD-10-CM | POA: Diagnosis not present

## 2023-11-13 DIAGNOSIS — Z13220 Encounter for screening for lipoid disorders: Secondary | ICD-10-CM | POA: Diagnosis not present

## 2023-11-16 DIAGNOSIS — Z0000 Encounter for general adult medical examination without abnormal findings: Secondary | ICD-10-CM | POA: Diagnosis not present

## 2023-11-30 DIAGNOSIS — Z800 Family history of malignant neoplasm of digestive organs: Secondary | ICD-10-CM | POA: Diagnosis not present

## 2023-11-30 DIAGNOSIS — Z1211 Encounter for screening for malignant neoplasm of colon: Secondary | ICD-10-CM | POA: Diagnosis not present

## 2024-01-01 DIAGNOSIS — F419 Anxiety disorder, unspecified: Secondary | ICD-10-CM | POA: Diagnosis not present

## 2024-01-01 DIAGNOSIS — F331 Major depressive disorder, recurrent, moderate: Secondary | ICD-10-CM | POA: Diagnosis not present

## 2024-01-15 DIAGNOSIS — Z7185 Encounter for immunization safety counseling: Secondary | ICD-10-CM | POA: Diagnosis not present

## 2024-01-15 DIAGNOSIS — F411 Generalized anxiety disorder: Secondary | ICD-10-CM | POA: Diagnosis not present

## 2024-01-15 DIAGNOSIS — Z23 Encounter for immunization: Secondary | ICD-10-CM | POA: Diagnosis not present

## 2024-01-15 DIAGNOSIS — M222X9 Patellofemoral disorders, unspecified knee: Secondary | ICD-10-CM | POA: Diagnosis not present

## 2024-01-19 DIAGNOSIS — F419 Anxiety disorder, unspecified: Secondary | ICD-10-CM | POA: Diagnosis not present

## 2024-02-05 DIAGNOSIS — Z1211 Encounter for screening for malignant neoplasm of colon: Secondary | ICD-10-CM | POA: Diagnosis not present

## 2024-02-05 DIAGNOSIS — K635 Polyp of colon: Secondary | ICD-10-CM | POA: Diagnosis not present

## 2024-03-04 DIAGNOSIS — F419 Anxiety disorder, unspecified: Secondary | ICD-10-CM | POA: Diagnosis not present

## 2024-03-04 DIAGNOSIS — F331 Major depressive disorder, recurrent, moderate: Secondary | ICD-10-CM | POA: Diagnosis not present
